# Patient Record
Sex: Female | Born: 1955 | Race: Black or African American | Hispanic: No | Marital: Married | Smoking: Never smoker
Health system: Southern US, Community
[De-identification: ages and names within clinical notes are randomized; demographics above are authoritative.]

## PROBLEM LIST (undated history)

## (undated) DIAGNOSIS — K5792 Diverticulitis of intestine, part unspecified, without perforation or abscess without bleeding: Secondary | ICD-10-CM

---

## 1998-04-02 ENCOUNTER — Other Ambulatory Visit: Admission: RE | Admit: 1998-04-02 | Discharge: 1998-04-02 | Payer: Self-pay | Admitting: *Deleted

## 1999-04-26 ENCOUNTER — Other Ambulatory Visit: Admission: RE | Admit: 1999-04-26 | Discharge: 1999-04-26 | Payer: Self-pay | Admitting: *Deleted

## 2000-03-24 ENCOUNTER — Ambulatory Visit (HOSPITAL_COMMUNITY): Admission: RE | Admit: 2000-03-24 | Discharge: 2000-03-24 | Payer: Self-pay | Admitting: *Deleted

## 2000-03-24 ENCOUNTER — Encounter: Payer: Self-pay | Admitting: *Deleted

## 2000-08-05 ENCOUNTER — Emergency Department (HOSPITAL_COMMUNITY): Admission: EM | Admit: 2000-08-05 | Discharge: 2000-08-05 | Payer: Self-pay | Admitting: Emergency Medicine

## 2000-08-08 ENCOUNTER — Encounter: Payer: Self-pay | Admitting: Internal Medicine

## 2000-08-08 ENCOUNTER — Ambulatory Visit (HOSPITAL_COMMUNITY): Admission: RE | Admit: 2000-08-08 | Discharge: 2000-08-08 | Payer: Self-pay | Admitting: Internal Medicine

## 2001-02-13 ENCOUNTER — Other Ambulatory Visit: Admission: RE | Admit: 2001-02-13 | Discharge: 2001-02-13 | Payer: Self-pay | Admitting: *Deleted

## 2002-01-08 ENCOUNTER — Other Ambulatory Visit: Admission: RE | Admit: 2002-01-08 | Discharge: 2002-01-08 | Payer: Self-pay | Admitting: *Deleted

## 2003-07-09 ENCOUNTER — Other Ambulatory Visit: Admission: RE | Admit: 2003-07-09 | Discharge: 2003-07-09 | Payer: Self-pay | Admitting: Family Medicine

## 2003-07-16 ENCOUNTER — Ambulatory Visit (HOSPITAL_COMMUNITY): Admission: RE | Admit: 2003-07-16 | Discharge: 2003-07-16 | Payer: Self-pay | Admitting: Family Medicine

## 2004-08-23 ENCOUNTER — Ambulatory Visit (HOSPITAL_COMMUNITY): Admission: RE | Admit: 2004-08-23 | Discharge: 2004-08-23 | Payer: Self-pay | Admitting: Family Medicine

## 2004-09-27 ENCOUNTER — Other Ambulatory Visit: Admission: RE | Admit: 2004-09-27 | Discharge: 2004-09-27 | Payer: Self-pay | Admitting: Family Medicine

## 2004-11-15 ENCOUNTER — Ambulatory Visit (HOSPITAL_COMMUNITY): Admission: RE | Admit: 2004-11-15 | Discharge: 2004-11-15 | Payer: Self-pay | Admitting: Gastroenterology

## 2005-10-05 ENCOUNTER — Other Ambulatory Visit: Admission: RE | Admit: 2005-10-05 | Discharge: 2005-10-05 | Payer: Self-pay | Admitting: Family Medicine

## 2005-12-15 ENCOUNTER — Ambulatory Visit (HOSPITAL_COMMUNITY): Admission: RE | Admit: 2005-12-15 | Discharge: 2005-12-15 | Payer: Self-pay | Admitting: Family Medicine

## 2006-12-20 ENCOUNTER — Ambulatory Visit (HOSPITAL_COMMUNITY): Admission: RE | Admit: 2006-12-20 | Discharge: 2006-12-20 | Payer: Self-pay | Admitting: Family Medicine

## 2007-01-18 ENCOUNTER — Other Ambulatory Visit: Admission: RE | Admit: 2007-01-18 | Discharge: 2007-01-18 | Payer: Self-pay | Admitting: Family Medicine

## 2008-01-10 ENCOUNTER — Ambulatory Visit (HOSPITAL_COMMUNITY): Admission: RE | Admit: 2008-01-10 | Discharge: 2008-01-10 | Payer: Self-pay | Admitting: Family Medicine

## 2008-01-21 ENCOUNTER — Encounter: Admission: RE | Admit: 2008-01-21 | Discharge: 2008-01-21 | Payer: Self-pay | Admitting: Family Medicine

## 2008-01-21 ENCOUNTER — Other Ambulatory Visit: Admission: RE | Admit: 2008-01-21 | Discharge: 2008-01-21 | Payer: Self-pay | Admitting: Family Medicine

## 2009-01-19 ENCOUNTER — Ambulatory Visit (HOSPITAL_COMMUNITY): Admission: RE | Admit: 2009-01-19 | Discharge: 2009-01-19 | Payer: Self-pay | Admitting: Family Medicine

## 2010-01-29 ENCOUNTER — Ambulatory Visit (HOSPITAL_COMMUNITY): Admission: RE | Admit: 2010-01-29 | Discharge: 2010-01-29 | Payer: Self-pay | Admitting: Family Medicine

## 2010-06-28 ENCOUNTER — Encounter: Payer: Self-pay | Admitting: Family Medicine

## 2010-09-07 ENCOUNTER — Other Ambulatory Visit: Payer: Self-pay | Admitting: Family Medicine

## 2010-09-07 DIAGNOSIS — M545 Low back pain, unspecified: Secondary | ICD-10-CM

## 2010-09-10 ENCOUNTER — Ambulatory Visit
Admission: RE | Admit: 2010-09-10 | Discharge: 2010-09-10 | Disposition: A | Discharge status: Home / Self Care | Payer: Private Health Insurance - Indemnity | Source: Ambulatory Visit | Attending: Family Medicine | Admitting: Family Medicine

## 2010-09-10 DIAGNOSIS — M545 Low back pain, unspecified: Secondary | ICD-10-CM

## 2010-10-22 NOTE — Op Note (Signed)
NAME:  YULEIDY, RAPPLEYE NO.:  [id number]   MEDICAL RECORD NO.:  [id number]          PATIENT TYPE:  AMB   LOCATION:  ENDO                         FACILITY:  [facility]   PHYSICIAN:  Graylin Shiver, M.D.   DATE OF BIRTH:  Jan 21, 1956   DATE OF PROCEDURE:  11/15/2004  DATE OF DISCHARGE:                                 OPERATIVE REPORT   PROCEDURE:  Colonoscopy.   INDICATIONS FOR PROCEDURE:  Screening.   Informed consent was obtained after explanation of the risks of bleeding,  infection, and perforation.   PREMEDICATION:  1.  Fentanyl 75 mcg IV.  2.  Versed 6 mg IV.   PROCEDURE:  With the patient in the left lateral decubitus position, a  rectal exam was performed.  No masses were felt.  The Olympus colonoscope  was inserted into the rectum and advanced around the colon to the cecum.  Cecal landmarks were identified.  The cecum and ascending colon were normal.  The transverse colon normal.  The descending colon, sigmoid, and rectum were  normal.  She tolerated the procedure well without complications.   IMPRESSION:  Normal colonoscopy to the cecum.       SFG/MEDQ  D:  11/15/2004  T:  11/15/2004  Job:  [id number]   cc:   Stacie Acres. White, M.D.  [address]  [place]  [place] [address]  Fax: [phone]

## 2011-01-05 ENCOUNTER — Other Ambulatory Visit (HOSPITAL_COMMUNITY)
Admission: RE | Admit: 2011-01-05 | Discharge: 2011-01-05 | Disposition: A | Discharge status: Home / Self Care | Payer: Private Health Insurance - Indemnity | Source: Ambulatory Visit | Attending: Family Medicine | Admitting: Family Medicine

## 2011-01-05 ENCOUNTER — Other Ambulatory Visit: Payer: Self-pay | Admitting: Family Medicine

## 2011-01-05 DIAGNOSIS — Z01419 Encounter for gynecological examination (general) (routine) without abnormal findings: Secondary | ICD-10-CM | POA: Insufficient documentation

## 2011-01-13 ENCOUNTER — Other Ambulatory Visit (HOSPITAL_COMMUNITY): Payer: Self-pay | Admitting: Family Medicine

## 2011-01-13 DIAGNOSIS — Z1231 Encounter for screening mammogram for malignant neoplasm of breast: Secondary | ICD-10-CM

## 2011-02-10 ENCOUNTER — Ambulatory Visit (HOSPITAL_COMMUNITY)
Admission: RE | Admit: 2011-02-10 | Discharge: 2011-02-10 | Disposition: A | Discharge status: Home / Self Care | Payer: Private Health Insurance - Indemnity | Source: Ambulatory Visit | Attending: Family Medicine | Admitting: Family Medicine

## 2011-02-10 DIAGNOSIS — Z1231 Encounter for screening mammogram for malignant neoplasm of breast: Secondary | ICD-10-CM | POA: Insufficient documentation

## 2012-03-27 ENCOUNTER — Other Ambulatory Visit (HOSPITAL_COMMUNITY): Payer: Self-pay | Admitting: Family Medicine

## 2012-03-27 DIAGNOSIS — Z1231 Encounter for screening mammogram for malignant neoplasm of breast: Secondary | ICD-10-CM

## 2012-04-11 ENCOUNTER — Ambulatory Visit (HOSPITAL_COMMUNITY)
Admission: RE | Admit: 2012-04-11 | Discharge: 2012-04-11 | Disposition: A | Discharge status: Home / Self Care | Payer: Private Health Insurance - Indemnity | Source: Ambulatory Visit | Attending: Family Medicine | Admitting: Family Medicine

## 2012-04-11 DIAGNOSIS — Z1231 Encounter for screening mammogram for malignant neoplasm of breast: Secondary | ICD-10-CM

## 2013-03-09 ENCOUNTER — Emergency Department (HOSPITAL_COMMUNITY): Payer: Managed Care, Other (non HMO)

## 2013-03-09 ENCOUNTER — Encounter (HOSPITAL_COMMUNITY): Payer: Self-pay | Admitting: *Deleted

## 2013-03-09 ENCOUNTER — Inpatient Hospital Stay (HOSPITAL_COMMUNITY)
Admission: EM | Admit: 2013-03-09 | Discharge: 2013-03-12 | DRG: 417 | Disposition: A | Discharge status: Home / Self Care | Payer: Managed Care, Other (non HMO) | Attending: General Surgery | Admitting: General Surgery

## 2013-03-09 DIAGNOSIS — K8020 Calculus of gallbladder without cholecystitis without obstruction: Secondary | ICD-10-CM | POA: Diagnosis present

## 2013-03-09 DIAGNOSIS — Z79899 Other long term (current) drug therapy: Secondary | ICD-10-CM

## 2013-03-09 DIAGNOSIS — K859 Acute pancreatitis without necrosis or infection, unspecified: Secondary | ICD-10-CM | POA: Diagnosis present

## 2013-03-09 DIAGNOSIS — K8062 Calculus of gallbladder and bile duct with acute cholecystitis without obstruction: Principal | ICD-10-CM | POA: Diagnosis present

## 2013-03-09 LAB — COMPREHENSIVE METABOLIC PANEL
Alkaline Phosphatase: 107 U/L (ref 39–117)
BUN: 13 mg/dL (ref 6–23)
CO2: 28 mEq/L (ref 19–32)
Chloride: 102 mEq/L (ref 96–112)
GFR calc Af Amer: 78 mL/min — ABNORMAL LOW (ref >90)
GFR calc non Af Amer: 67 mL/min — ABNORMAL LOW (ref >90)
Glucose, Bld: 103 mg/dL — ABNORMAL HIGH (ref 70–99)
Potassium: 4.4 mEq/L (ref 3.5–5.1)
Total Bilirubin: 0.3 mg/dL (ref 0.3–1.2)

## 2013-03-09 LAB — CBC WITH DIFFERENTIAL/PLATELET
HCT: 37.5 % (ref 36.0–46.0)
Hemoglobin: 12.6 g/dL (ref 12.0–15.0)
Lymphs Abs: 3.3 10*3/uL (ref 0.7–4.0)
Monocytes Absolute: 0.7 10*3/uL (ref 0.1–1.0)
Monocytes Relative: 7 % (ref 3–12)
Neutro Abs: 6.1 10*3/uL (ref 1.7–7.7)
Neutrophils Relative %: 60 % (ref 43–77)
RBC: 4.31 MIL/uL (ref 3.87–5.11)

## 2013-03-09 LAB — URINALYSIS, ROUTINE W REFLEX MICROSCOPIC
Bilirubin Urine: NEGATIVE
Ketones, ur: NEGATIVE mg/dL
Nitrite: NEGATIVE
Protein, ur: NEGATIVE mg/dL
Urobilinogen, UA: 0.2 mg/dL (ref 0.0–1.0)
pH: 7 (ref 5.0–8.0)

## 2013-03-09 LAB — LIPASE, BLOOD: Lipase: 153 U/L — ABNORMAL HIGH (ref 11–59)

## 2013-03-09 MED ORDER — ONDANSETRON HCL 4 MG/2ML IJ SOLN
4.0000 mg | Freq: Once | INTRAMUSCULAR | Status: AC
  Administered 2013-03-09: 4 mg via INTRAVENOUS
  Filled 2013-03-09: qty 2

## 2013-03-09 MED ORDER — ONDANSETRON HCL 4 MG/2ML IJ SOLN
4.0000 mg | Freq: Four times a day (QID) | INTRAMUSCULAR | Status: DC | PRN
  Administered 2013-03-10: 4 mg via INTRAVENOUS
  Filled 2013-03-09 (×2): qty 2

## 2013-03-09 MED ORDER — SODIUM CHLORIDE 0.9 % IV BOLUS (SEPSIS)
1000.0000 mL | Freq: Once | INTRAVENOUS | Status: AC
  Administered 2013-03-09: 1000 mL via INTRAVENOUS

## 2013-03-09 MED ORDER — MORPHINE SULFATE 2 MG/ML IJ SOLN
2.0000 mg | INTRAMUSCULAR | Status: DC | PRN
  Administered 2013-03-09: 2 mg via INTRAVENOUS
  Filled 2013-03-09: qty 1

## 2013-03-09 MED ORDER — ACETAMINOPHEN 325 MG PO TABS: 650.0000 mg | ORAL_TABLET | Freq: Four times a day (QID) | ORAL | Status: DC | PRN

## 2013-03-09 MED ORDER — HEPARIN SODIUM (PORCINE) 5000 UNIT/ML IJ SOLN
5000.0000 [IU] | Freq: Three times a day (TID) | INTRAMUSCULAR | Status: DC
  Administered 2013-03-09 – 2013-03-12 (×7): 5000 [IU] via SUBCUTANEOUS
  Filled 2013-03-09 (×11): qty 1

## 2013-03-09 MED ORDER — HYDROMORPHONE HCL PF 1 MG/ML IJ SOLN
1.0000 mg | Freq: Once | INTRAMUSCULAR | Status: AC
  Administered 2013-03-09: 1 mg via INTRAVENOUS
  Filled 2013-03-09: qty 1

## 2013-03-09 MED ORDER — ONDANSETRON HCL 4 MG PO TABS: 4.0000 mg | ORAL_TABLET | Freq: Four times a day (QID) | ORAL | Status: DC | PRN

## 2013-03-09 MED ORDER — ACETAMINOPHEN 650 MG RE SUPP: 650.0000 mg | Freq: Four times a day (QID) | RECTAL | Status: DC | PRN

## 2013-03-09 MED ORDER — SODIUM CHLORIDE 0.9 % IV SOLN
INTRAVENOUS | Status: DC
  Administered 2013-03-09: 23:00:00 via INTRAVENOUS

## 2013-03-09 NOTE — ED Provider Notes (Signed)
CSN: [id number]     Arrival date & time 03/09/13  1814 History   First MD Initiated Contact with Patient 03/09/13 1832     Chief Complaint  Patient presents with  . Abdominal Pain   (Consider location/radiation/quality/duration/timing/severity/associated sxs/prior Treatment) HPI I 57 y.o. Female complaining of epigastric abdominal pain. The pain began approximately 2 days ago and has been constant in nature. It is worsened with any by mouth intake. She has been nauseated but has not vomited. She has not had any fever, chills, or diarrhea. She has had no similar episodes in the past. She is previously healthy and does not take any medications. She does not drink alcohol or smoke. The pain is severe and crampy in the upper abdomen with some radiation to the back.  History reviewed. No pertinent past medical history. No past surgical history on file. No family history on file. History  Substance Use Topics  . Smoking status: Not on file  . Smokeless tobacco: Not on file  . Alcohol Use: Not on file   OB History   Grav Para Term Preterm Abortions TAB SAB Ect Mult Living                 Review of Systems  All other systems reviewed and are negative.    Allergies  Review of patient's allergies indicates no known allergies.  Home Medications   Current Outpatient Rx  Name  Route  Sig  Dispense  Refill  . bismuth subsalicylate (PEPTO BISMOL) 262 MG/15ML suspension   Oral   Take 15 mLs by mouth every 6 (six) hours as needed for indigestion.         . chlorpheniramine-HYDROcodone (TUSSIONEX) 10-8 MG/5ML LQCR   Oral   Take 5 mLs by mouth every 12 (twelve) hours as needed (cough).          Marland Kitchen HYDROcodone-homatropine (HYCODAN) 5-1.5 MG/5ML syrup   Oral   Take 5 mLs by mouth every 4 (four) hours as needed for cough.           BP 115/79  Pulse 85  Temp(Src) 98.7 F (37.1 C) (Oral)  Resp 16  SpO2 100% Physical Exam  Nursing note and vitals reviewed. Constitutional: She is  oriented to person, place, and time. She appears well-developed and well-nourished.  HENT:  Head: Normocephalic and atraumatic.  Right Ear: External ear normal.  Left Ear: External ear normal.  Nose: Nose normal.  Mouth/Throat: Oropharynx is clear and moist.  Eyes: Conjunctivae and EOM are normal. Pupils are equal, round, and reactive to light.  Neck: Neck supple.  Cardiovascular: Normal rate, regular rhythm, normal heart sounds and intact distal pulses.   Pulmonary/Chest: Effort normal and breath sounds normal.  Abdominal: Soft. Bowel sounds are normal.  Musculoskeletal: Normal range of motion.  Neurological: She is alert and oriented to person, place, and time. She has normal reflexes.  Skin: Skin is warm and dry.  Psychiatric: She has a normal mood and affect. Her behavior is normal. Judgment and thought content normal.    ED Course  Procedures (including critical care time) Labs Review Labs Reviewed  COMPREHENSIVE METABOLIC PANEL - Abnormal; Notable for the following:    Glucose, Bld 103 (*)    Albumin 3.3 (*)    AST 43 (*)    ALT 68 (*)    GFR calc non Af Amer 67 (*)    GFR calc Af Amer 78 (*)    All other components within normal limits  LIPASE,  BLOOD - Abnormal; Notable for the following:    Lipase 153 (*)    All other components within normal limits  URINALYSIS, ROUTINE W REFLEX MICROSCOPIC - Abnormal; Notable for the following:    Hgb urine dipstick SMALL (*)    Leukocytes, UA SMALL (*)    All other components within normal limits  CBC WITH DIFFERENTIAL  URINE MICROSCOPIC-ADD ON   Imaging Review US Abdomen Complete  03/09/2013   CLINICAL DATA:  Abdominal pain and nausea  EXAM: ULTRASOUND ABDOMEN COMPLETE  COMPARISON:  CT 10/17/2005  FINDINGS: Gallbladder  Dependent gallstone noted, largest 1 cm. No gallbladder wall thickening, pericholecystic fluid, or sonographic Murphy sign.  Common bile duct  Diameter: 4 mm. Normal.  Liver  No focal lesion identified. Within  normal limits in parenchymal echogenicity.  IVC  No abnormality visualized.  Pancreas  Visualized portion unremarkable.  Spleen  Size and appearance within normal limits.  Right Kidney  Length: 11.0 cm. Extra renal pelvis reidentified. Echogenicity within normal limits. No mass or hydronephrosis visualized.  Left Kidney  Length: 11.0 cm. Extra renal pelvis reidentified. Echogenicity within normal limits. No mass or hydronephrosis visualized.  Abdominal aorta  No aneurysm visualized.  IMPRESSION: Gallstone without other sonographic evidence for acute cholecystitis. No acute intra-abdominal abnormality.   Electronically Signed   By: Christiana Pellant M.D.   On: 03/09/2013 21:00    MDM   Results for orders placed during the hospital encounter of 03/09/13  CBC WITH DIFFERENTIAL      Result Value Range   WBC 10.3  4.0 - 10.5 K/uL   RBC 4.31  3.87 - 5.11 MIL/uL   Hemoglobin 12.6  12.0 - 15.0 g/dL   HCT 16.1  09.6 - 04.5 %   MCV 87.0  78.0 - 100.0 fL   MCH 29.2  26.0 - 34.0 pg   MCHC 33.6  30.0 - 36.0 g/dL   RDW 40.9  81.1 - 91.4 %   Platelets 340  150 - 400 K/uL   Neutrophils Relative % 60  43 - 77 %   Neutro Abs 6.1  1.7 - 7.7 K/uL   Lymphocytes Relative 32  12 - 46 %   Lymphs Abs 3.3  0.7 - 4.0 K/uL   Monocytes Relative 7  3 - 12 %   Monocytes Absolute 0.7  0.1 - 1.0 K/uL   Eosinophils Relative 1  0 - 5 %   Eosinophils Absolute 0.1  0.0 - 0.7 K/uL   Basophils Relative 0  0 - 1 %   Basophils Absolute 0.0  0.0 - 0.1 K/uL  COMPREHENSIVE METABOLIC PANEL      Result Value Range   Sodium 139  135 - 145 mEq/L   Potassium 4.4  3.5 - 5.1 mEq/L   Chloride 102  96 - 112 mEq/L   CO2 28  19 - 32 mEq/L   Glucose, Bld 103 (*) 70 - 99 mg/dL   BUN 13  6 - 23 mg/dL   Creatinine, Ser 7.82  0.50 - 1.10 mg/dL   Calcium 9.3  8.4 - 95.6 mg/dL   Total Protein 7.5  6.0 - 8.3 g/dL   Albumin 3.3 (*) 3.5 - 5.2 g/dL   AST 43 (*) 0 - 37 U/L   ALT 68 (*) 0 - 35 U/L   Alkaline Phosphatase 107  39 - 117 U/L    Total Bilirubin 0.3  0.3 - 1.2 mg/dL   GFR calc non Af Amer 67 (*) >90 mL/min  GFR calc Af Amer 78 (*) >90 mL/min  LIPASE, BLOOD      Result Value Range   Lipase 153 (*) 11 - 59 U/L  URINALYSIS, ROUTINE W REFLEX MICROSCOPIC      Result Value Range   Color, Urine YELLOW  YELLOW   APPearance CLEAR  CLEAR   Specific Gravity, Urine 1.009  1.005 - 1.030   pH 7.0  5.0 - 8.0   Glucose, UA NEGATIVE  NEGATIVE mg/dL   Hgb urine dipstick SMALL (*) NEGATIVE   Bilirubin Urine NEGATIVE  NEGATIVE   Ketones, ur NEGATIVE  NEGATIVE mg/dL   Protein, ur NEGATIVE  NEGATIVE mg/dL   Urobilinogen, UA 0.2  0.0 - 1.0 mg/dL   Nitrite NEGATIVE  NEGATIVE   Leukocytes, UA SMALL (*) NEGATIVE  URINE MICROSCOPIC-ADD ON      Result Value Range   WBC, UA 0-2  <3 WBC/hpf   RBC / HPF 0-2  <3 RBC/hpf    57 year old female with epigastric pain and mildly elevated liver function tests and elevated lipase at 153 with gallstones seen on ultrasound likely with gallstone pancreatitis. She's given IV fluids, pain medicines, and antibiotics. Surgery and GI are being consulted. Plan consult to hospitalist for management.   Discussed with Dr. Maisie Fus on call for general surgery and they will see in consult   Discussed with Dr. Rhona Leavens and plan admit to [facility].  Awaiting gi. Discussed with Dr.Hung and he will see patient in consult.   Hilario Quarry, MD 03/09/13 [phone]

## 2013-03-09 NOTE — H&P (Signed)
[facility] History and Physical  DRAVEN LAINE ZOX:[id number] DOB: [birth year] DOA: 03/09/2013  Referring physician: Emergency Department PCP: Leanor Rubenstein, MD  Specialists:   Chief Complaint: Abdominal Pain  HPI: Kimberly Farrell is a 57 y.o. female  With no significant medical history who presents to the ED with complaints of acute abd pain, n/v that started several days ago. Pt initially attributed sx to "bad gas," however sx did not improve so pt wend to ED.  In the ED, the patient was found to have an elevated lipase to over 150 with a gallstone noted on abd [place]. Surgery and GI were consulted through the ED and the hospitalist was consulted for admission.  Review of Systems:  Per above, remainder of 10pt ros reviewed and are neg  History reviewed. No pertinent past medical history. No past surgical history on file. Social History:  has no tobacco, alcohol, and drug history on file.  where does patient live--home, ALF, SNF? and with whom if at home?  Can patient participate in ADLs?  No Known Allergies  No family history on file.  (be sure to complete)  Prior to Admission medications   Medication Sig Start Date End Date Taking? Authorizing Provider  bismuth subsalicylate (PEPTO BISMOL) 262 MG/15ML suspension Take 15 mLs by mouth every 6 (six) hours as needed for indigestion.   Yes Historical Provider, MD  chlorpheniramine-HYDROcodone (TUSSIONEX) 10-8 MG/5ML LQCR Take 5 mLs by mouth every 12 (twelve) hours as needed (cough).  03/04/13  Yes Historical Provider, MD  HYDROcodone-homatropine (HYCODAN) 5-1.5 MG/5ML syrup Take 5 mLs by mouth every 4 (four) hours as needed for cough.  02/27/13  Yes Historical Provider, MD   Physical Exam: Filed Vitals:   03/09/13 1828  BP: 115/79  Pulse: 85  Temp: 98.7 F (37.1 C)  TempSrc: Oral  Resp: 16  SpO2: 100%    General:  Awake, in nad  Eyes: PERRL B  ENT: Membranes moist, dentition fair  Neck: trachea midline, neck  supple  Cardiovascular: regular, s1, s2  Respiratory: normal resp effort, no wheezing  Abdomen: soft, pos BS, tender in epigastric region  Skin: normal skin turgor, no abnormal skin lesions seen  Musculoskeletal: perfused, no clubbing  Psychiatric: mood/affect normal // no auditory/visual hallucinations  Neurologic: cn2-12 grossly intact, strength/sensation intact  Labs on Admission:  Basic Metabolic Panel:  Recent Labs Lab 03/09/13 1915  NA 139  K 4.4  CL 102  CO2 28  GLUCOSE 103*  BUN 13  CREATININE 0.92  CALCIUM 9.3   Liver Function Tests:  Recent Labs Lab 03/09/13 1915  AST 43*  ALT 68*  ALKPHOS 107  BILITOT 0.3  PROT 7.5  ALBUMIN 3.3*    Recent Labs Lab 03/09/13 1915  LIPASE 153*   No results found for this basename: AMMONIA,  in the last 168 hours CBC:  Recent Labs Lab 03/09/13 1915  WBC 10.3  NEUTROABS 6.1  HGB 12.6  HCT 37.5  MCV 87.0  PLT 340   Cardiac Enzymes: No results found for this basename: CKTOTAL, CKMB, CKMBINDEX, TROPONINI,  in the last 168 hours  BNP (last 3 results) No results found for this basename: PROBNP,  in the last 8760 hours CBG: No results found for this basename: GLUCAP,  in the last 168 hours  Radiological Exams on Admission: US Abdomen Complete  03/09/2013   CLINICAL DATA:  Abdominal pain and nausea  EXAM: ULTRASOUND ABDOMEN COMPLETE  COMPARISON:  CT 10/17/2005  FINDINGS: Gallbladder  Dependent gallstone  noted, largest 1 cm. No gallbladder wall thickening, pericholecystic fluid, or sonographic Murphy sign.  Common bile duct  Diameter: 4 mm. Normal.  Liver  No focal lesion identified. Within normal limits in parenchymal echogenicity.  IVC  No abnormality visualized.  Pancreas  Visualized portion unremarkable.  Spleen  Size and appearance within normal limits.  Right Kidney  Length: 11.0 cm. Extra renal pelvis reidentified. Echogenicity within normal limits. No mass or hydronephrosis visualized.  Left Kidney   Length: 11.0 cm. Extra renal pelvis reidentified. Echogenicity within normal limits. No mass or hydronephrosis visualized.  Abdominal aorta  No aneurysm visualized.  IMPRESSION: Gallstone without other sonographic evidence for acute cholecystitis. No acute intra-abdominal abnormality.   Electronically Signed   By: Christiana Pellant M.D.   On: 03/09/2013 21:00    Assessment/Plan Principal Problem:   Acute pancreatitis Active Problems:   Gall stones   1. Acute pancreatitis 1. Cont NPO with IVF and analgesics 2. Repeat lipase in AM 3. GI and General Surgery have already been consulted through the ED. Would f/u on recs 4. Admit to med-surg floor, inpt 2. Gall stones 1. No evidence of acute cholecystitis 2. Per above, Surgery consulted 3. DVT prophylaxis 1. Heparin subQ  Code Status: Full (must indicate code status--if unknown or must be presumed, indicate so) Family Communication: Pt in room (indicate person spoken with, if applicable, with phone number if by telephone) Disposition Plan: Pending (indicate anticipated LOS)  Time spent:  Dacie Mandel K [facility] Pager [phone]  If 7PM-7AM, please contact night-coverage [email or web address] Password [email or web address] 03/09/2013, 9:35 PM

## 2013-03-09 NOTE — ED Notes (Signed)
Reports abdominal pain since Tuesday, 7/10. Reports nausea. Denies vomiting.

## 2013-03-10 ENCOUNTER — Encounter (HOSPITAL_COMMUNITY): Admission: EM | Disposition: A | Discharge status: Home / Self Care | Payer: Self-pay | Source: Home / Self Care

## 2013-03-10 ENCOUNTER — Encounter (HOSPITAL_COMMUNITY): Payer: Self-pay | Admitting: Certified Registered Nurse Anesthetist

## 2013-03-10 ENCOUNTER — Inpatient Hospital Stay (HOSPITAL_COMMUNITY): Payer: Managed Care, Other (non HMO)

## 2013-03-10 ENCOUNTER — Encounter (HOSPITAL_COMMUNITY): Payer: Self-pay | Admitting: Unknown Physician Specialty

## 2013-03-10 ENCOUNTER — Inpatient Hospital Stay (HOSPITAL_COMMUNITY): Payer: Managed Care, Other (non HMO) | Admitting: Certified Registered Nurse Anesthetist

## 2013-03-10 DIAGNOSIS — K8010 Calculus of gallbladder with chronic cholecystitis without obstruction: Secondary | ICD-10-CM

## 2013-03-10 HISTORY — PX: CHOLECYSTECTOMY: SHX55

## 2013-03-10 LAB — COMPREHENSIVE METABOLIC PANEL
AST: 30 U/L (ref 0–37)
Albumin: 2.6 g/dL — ABNORMAL LOW (ref 3.5–5.2)
BUN: 8 mg/dL (ref 6–23)
CO2: 24 mEq/L (ref 19–32)
Calcium: 8.3 mg/dL — ABNORMAL LOW (ref 8.4–10.5)
Creatinine, Ser: 0.78 mg/dL (ref 0.50–1.10)
Sodium: 139 mEq/L (ref 135–145)
Total Protein: 6.3 g/dL (ref 6.0–8.3)

## 2013-03-10 LAB — CBC
HCT: 33.9 % — ABNORMAL LOW (ref 36.0–46.0)
Hemoglobin: 11.4 g/dL — ABNORMAL LOW (ref 12.0–15.0)
MCV: 86.7 fL (ref 78.0–100.0)
Platelets: 301 10*3/uL (ref 150–400)
RBC: 3.91 MIL/uL (ref 3.87–5.11)
WBC: 9.2 10*3/uL (ref 4.0–10.5)

## 2013-03-10 SURGERY — LAPAROSCOPIC CHOLECYSTECTOMY WITH INTRAOPERATIVE CHOLANGIOGRAM
Anesthesia: General | Site: Abdomen | Wound class: Clean Contaminated

## 2013-03-10 MED ORDER — CEFOXITIN SODIUM 2 G IV SOLR
2.0000 g | INTRAVENOUS | Status: AC
  Administered 2013-03-10: 2 g via INTRAVENOUS
  Filled 2013-03-10: qty 2

## 2013-03-10 MED ORDER — DEXTROSE 5 % IV SOLN
INTRAVENOUS | Status: AC
  Filled 2013-03-10: qty 1

## 2013-03-10 MED ORDER — LACTATED RINGERS IV SOLN
INTRAVENOUS | Status: DC | PRN
  Administered 2013-03-10 (×2): via INTRAVENOUS

## 2013-03-10 MED ORDER — NALOXONE HCL 0.4 MG/ML IJ SOLN
INTRAMUSCULAR | Status: DC | PRN
  Administered 2013-03-10: 40 ug via INTRAVENOUS

## 2013-03-10 MED ORDER — SODIUM CHLORIDE 0.9 % IV SOLN
INTRAVENOUS | Status: DC
  Administered 2013-03-10 – 2013-03-11 (×2): via INTRAVENOUS

## 2013-03-10 MED ORDER — ONDANSETRON HCL 4 MG/2ML IJ SOLN
INTRAMUSCULAR | Status: DC | PRN
  Administered 2013-03-10 (×2): 2 mg via INTRAVENOUS

## 2013-03-10 MED ORDER — CISATRACURIUM BESYLATE (PF) 10 MG/5ML IV SOLN
INTRAVENOUS | Status: DC | PRN
  Administered 2013-03-10: 6 mg via INTRAVENOUS

## 2013-03-10 MED ORDER — SUCCINYLCHOLINE CHLORIDE 20 MG/ML IJ SOLN
INTRAMUSCULAR | Status: DC | PRN
  Administered 2013-03-10: 100 mg via INTRAVENOUS

## 2013-03-10 MED ORDER — BUPIVACAINE-EPINEPHRINE 0.25% -1:200000 IJ SOLN
INTRAMUSCULAR | Status: DC | PRN
  Administered 2013-03-10: 20 mL

## 2013-03-10 MED ORDER — PROPOFOL 10 MG/ML IV BOLUS
INTRAVENOUS | Status: DC | PRN
  Administered 2013-03-10: 175 mg via INTRAVENOUS

## 2013-03-10 MED ORDER — 0.9 % SODIUM CHLORIDE (POUR BTL) OPTIME
TOPICAL | Status: DC | PRN
  Administered 2013-03-10: 1000 mL

## 2013-03-10 MED ORDER — BUPIVACAINE-EPINEPHRINE 0.25% -1:200000 IJ SOLN
INTRAMUSCULAR | Status: AC
  Filled 2013-03-10: qty 1

## 2013-03-10 MED ORDER — IOHEXOL 300 MG/ML  SOLN
INTRAMUSCULAR | Status: DC | PRN
  Administered 2013-03-10: 6 mL via INTRAVENOUS

## 2013-03-10 MED ORDER — DEXAMETHASONE SODIUM PHOSPHATE 10 MG/ML IJ SOLN
INTRAMUSCULAR | Status: DC | PRN
  Administered 2013-03-10: 10 mg via INTRAVENOUS

## 2013-03-10 MED ORDER — LACTATED RINGERS IR SOLN
Status: DC | PRN
  Administered 2013-03-10: 1

## 2013-03-10 MED ORDER — MIDAZOLAM HCL 5 MG/5ML IJ SOLN
INTRAMUSCULAR | Status: DC | PRN
  Administered 2013-03-10: 0.5 mg via INTRAVENOUS
  Administered 2013-03-10: 1 mg via INTRAVENOUS

## 2013-03-10 MED ORDER — EPHEDRINE SULFATE 50 MG/ML IJ SOLN
INTRAMUSCULAR | Status: DC | PRN
  Administered 2013-03-10 (×2): 5 mg via INTRAVENOUS

## 2013-03-10 MED ORDER — OXYCODONE-ACETAMINOPHEN 5-325 MG PO TABS
1.0000 | ORAL_TABLET | ORAL | Status: DC | PRN
  Administered 2013-03-11 (×3): 2 via ORAL
  Filled 2013-03-10 (×3): qty 2

## 2013-03-10 MED ORDER — NEOSTIGMINE METHYLSULFATE 1 MG/ML IJ SOLN
INTRAMUSCULAR | Status: DC | PRN
  Administered 2013-03-10: 5 mg via INTRAVENOUS

## 2013-03-10 MED ORDER — HYDROMORPHONE HCL PF 1 MG/ML IJ SOLN: 0.2500 mg | INTRAMUSCULAR | Status: DC | PRN

## 2013-03-10 MED ORDER — MORPHINE SULFATE 2 MG/ML IJ SOLN
2.0000 mg | INTRAMUSCULAR | Status: DC | PRN
  Administered 2013-03-10 (×2): 4 mg via INTRAVENOUS
  Administered 2013-03-10: 2 mg via INTRAVENOUS
  Administered 2013-03-10 – 2013-03-11 (×3): 4 mg via INTRAVENOUS
  Administered 2013-03-11 (×2): 2 mg via INTRAVENOUS
  Filled 2013-03-10: qty 2
  Filled 2013-03-10 (×2): qty 1
  Filled 2013-03-10 (×4): qty 2
  Filled 2013-03-10: qty 1

## 2013-03-10 MED ORDER — GLYCOPYRROLATE 0.2 MG/ML IJ SOLN
INTRAMUSCULAR | Status: DC | PRN
  Administered 2013-03-10: .8 mg via INTRAVENOUS

## 2013-03-10 MED ORDER — PHENYLEPHRINE HCL 10 MG/ML IJ SOLN
INTRAMUSCULAR | Status: DC | PRN
  Administered 2013-03-10 (×3): 40 ug via INTRAVENOUS

## 2013-03-10 MED ORDER — HYDROMORPHONE HCL PF 1 MG/ML IJ SOLN
INTRAMUSCULAR | Status: AC
  Filled 2013-03-10: qty 1

## 2013-03-10 MED ORDER — LIDOCAINE HCL (CARDIAC) 20 MG/ML IV SOLN
INTRAVENOUS | Status: DC | PRN
  Administered 2013-03-10: 75 mg via INTRAVENOUS

## 2013-03-10 MED ORDER — FENTANYL CITRATE 0.05 MG/ML IJ SOLN
INTRAMUSCULAR | Status: DC | PRN
  Administered 2013-03-10: 100 ug via INTRAVENOUS
  Administered 2013-03-10 (×3): 50 ug via INTRAVENOUS

## 2013-03-10 SURGICAL SUPPLY — 35 items
ADH SKN CLS APL DERMABOND .7 (GAUZE/BANDAGES/DRESSINGS) ×1
APPLIER CLIP 5 13 M/L LIGAMAX5 (MISCELLANEOUS) ×2
APR CLP MED LRG 5 ANG JAW (MISCELLANEOUS) ×1
BAG SPEC RTRVL LRG 6X4 10 (ENDOMECHANICALS) ×1
CABLE HIGH FREQUENCY MONO STRZ (ELECTRODE) ×2 IMPLANT
CANISTER SUCTION 2500CC (MISCELLANEOUS) ×2 IMPLANT
CHLORAPREP W/TINT 26ML (MISCELLANEOUS) ×2 IMPLANT
CLIP APPLIE 5 13 M/L LIGAMAX5 (MISCELLANEOUS) ×1 IMPLANT
CLOTH BEACON ORANGE TIMEOUT ST (SAFETY) ×2 IMPLANT
COVER MAYO STAND STRL (DRAPES) ×1 IMPLANT
DERMABOND ADVANCED (GAUZE/BANDAGES/DRESSINGS) ×1
DERMABOND ADVANCED .7 DNX12 (GAUZE/BANDAGES/DRESSINGS) ×1 IMPLANT
DRAPE C-ARM 42X120 X-RAY (DRAPES) ×2 IMPLANT
DRAPE LAPAROSCOPIC ABDOMINAL (DRAPES) ×2 IMPLANT
DRAPE UTILITY XL STRL (DRAPES) ×2 IMPLANT
ELECT REM PT RETURN 9FT ADLT (ELECTROSURGICAL) ×2
ELECTRODE REM PT RTRN 9FT ADLT (ELECTROSURGICAL) ×1 IMPLANT
GOWN BRE IMP PREV XXLGXLNG (GOWN DISPOSABLE) ×2 IMPLANT
GOWN STRL REIN XL XLG (GOWN DISPOSABLE) ×4 IMPLANT
KIT BASIN OR (CUSTOM PROCEDURE TRAY) ×2 IMPLANT
NS IRRIG 1000ML POUR BTL (IV SOLUTION) ×2 IMPLANT
POUCH SPECIMEN RETRIEVAL 10MM (ENDOMECHANICALS) ×2 IMPLANT
SCISSORS ENDO CVD 5DCS (MISCELLANEOUS) ×2 IMPLANT
SET CHOLANGIOGRAPH MIX (MISCELLANEOUS) ×2 IMPLANT
SET IRRIG TUBING LAPAROSCOPIC (IRRIGATION / IRRIGATOR) ×2 IMPLANT
SOLUTION ANTI FOG 6CC (MISCELLANEOUS) ×2 IMPLANT
SUT MNCRL AB 4-0 PS2 18 (SUTURE) ×2 IMPLANT
SUT VIC AB 4-0 PS2 27 (SUTURE) ×1 IMPLANT
TOWEL OR 17X26 10 PK STRL BLUE (TOWEL DISPOSABLE) ×2 IMPLANT
TOWEL OR NON WOVEN STRL DISP B (DISPOSABLE) ×2 IMPLANT
TRAY LAP CHOLE (CUSTOM PROCEDURE TRAY) ×2 IMPLANT
TROCAR BLADELESS OPT 5 75 (ENDOMECHANICALS) ×2 IMPLANT
TROCAR SLEEVE XCEL 5X75 (ENDOMECHANICALS) ×4 IMPLANT
TROCAR XCEL BLUNT TIP 100MML (ENDOMECHANICALS) ×2 IMPLANT
TUBING INSUFFLATION 10FT LAP (TUBING) ×2 IMPLANT

## 2013-03-10 NOTE — Op Note (Signed)
03/09/2013 - 03/10/2013  10:34 AM  PATIENT:  Kimberly Farrell  57 y.o. female  Patient Care Team: Leanor Rubenstein, MD as PCP - General (Family Medicine)  PRE-OPERATIVE DIAGNOSIS:  Pancreatitis, cholelithiasis  POST-OPERATIVE DIAGNOSIS:  Pancreatitis, cholelithiasis  PROCEDURE:  LAPAROSCOPIC CHOLECYSTECTOMY WITH INTRAOPERATIVE CHOLANGIOGRAM  SURGEON:  Surgeon(s): [name], MD Kandis Cocking, MD  ASSISTANT: Ezzard Standing   ANESTHESIA:   local and general  EBL: 50ml Total I/O In: 1000 [I.V.:1000] Out: -   DRAINS: none   SPECIMEN:  Source of Specimen:  gallbladder  DISPOSITION OF SPECIMEN:  PATHOLOGY  COUNTS:  YES  PLAN OF CARE: Patient admitted  PATIENT DISPOSITION:  PACU - hemodynamically stable.  INDICATION:   The anatomy & physiology of hepatobiliary & pancreatic function was discussed.  The pathophysiology of gallbladder dysfunction was discussed.  Natural history risks without surgery was discussed.   I feel the risks of no intervention will lead to serious problems that outweigh the operative risks; therefore, I recommended cholecystectomy to remove the pathology.  I explained laparoscopic techniques with possible need for an open approach.  Probable cholangiogram to evaluate the bilary tract was explained as well.    Risks such as bleeding, infection, abscess, leak, injury to other organs, need for further treatment, heart attack, death, and other risks were discussed.  I noted a good likelihood this will help address the problem.  Possibility that this will not correct all abdominal symptoms was explained.  Goals of post-operative recovery were discussed as well.    OR FINDINGS: mildly inflamed gallbladder neck, small gallstones  DESCRIPTION:   The patient was identified & brought into the operating room. The patient was positioned supine with arms tucked. SCDs were active during the entire case. The patient underwent general anesthesia without any difficulty.  The  abdomen was prepped and draped in a sterile fashion. A Surgical Timeout was performed and confirmed our plan.  We positioned the patient in reverse [place] & right side up.  I placed a Hassan laparoscopic port through the umbilicus using open entry technique.  Entry was clean. There were no adhesions to the anterior abdominal wall supraumbilically.  We induced carbon dioxide insufflation. Camera inspection revealed no injury.   I proceeded to continue with laparoscopic technique. I placed a #5 port in mid subcostal region, another 5mm port in the right flank near the anterior axillary line, and a 5mm port in the left subxiphoid region obliquely within the falciform ligament.  I turned attention to the right upper quadrant.   The gallbladder fundus was elevated cephalad. I used cautery and blunt dissection to free the peritoneal coverings between the gallbladder and the liver on the posteriolateral and anteriomedial walls.   I used careful blunt and cautery dissection with a mary[place] dissector to help get a good critical view of the cystic artery and cystic duct. I did further dissection to free a few centimeters of the  gallbladder off the liver bed to get a good critical view of the infundibulum and cystic duct. I mobilized the cystic artery.  I skeletonized the cystic duct.  After getting a good 360 view, I decided to perform a cholangiogram.  I placed a clip on the infundibulum.   I did a partial cystic duct-otomy and ensured patency. I placed a 5 [place] cholangiocatheter through a puncture site at the right subcostal ridge of the abdominal wall and directed it into the cystic duct.  We ran a cholangiogram with dilute radio-opaque contrast and continuous  fluoroscopy.  Contrast flowed from a side branch consistent with cystic duct cannulization. Contrast flowed up the common hepatic duct into the right and left intrahepatic chains out to secondary radicals. Contrast flowed down the common bile duct  easily across the normal ampulla into the duodenum.  There were no filling defects.  This was consistent with a normal cholangiogram.  I removed the cholangiocatheter.  I placed clips on the cystic duct x3.  I completed cystic duct transection.   I placed clips on the cystic artery x3 with 2 proximally.  I ligated the cystic artery using scissors. I freed the gallbladder from its remaining attachments to the liver. I ensured hemostasis on the gallbladder fossa of the liver and elsewhere. I inspected the rest of the abdomen & detected no injury nor bleeding elsewhere.  I irrigated the RUQ with normal saline.  I removed the gallbladder through the umbilical port site.  I closed the umbilical fascia using 0 Vicryl stitches.   I closed the skin using 4-0 vicryl stitch.  Sterile dressings were applied. The patient was extubated & arrived in the PACU in stable condition.  I had discussed postoperative care with the patient in the holding area.   I will discuss  operative findings and postoperative goals / instructions with the patient's family.  Instructions are written in the chart as well.

## 2013-03-10 NOTE — Anesthesia Postprocedure Evaluation (Signed)
  Anesthesia Post-op Note  Patient: Kimberly Farrell  Procedure(s) Performed: Procedure(s): LAPAROSCOPIC CHOLECYSTECTOMY WITH INTRAOPERATIVE CHOLANGIOGRAM (N/A) Patient is awake and responsive, talking with no complaints Pain and nausea are reasonably well controlled. Vital signs are stable and clinically acceptable. Oxygen saturation is clinically acceptable. There are no apparent anesthetic complications at this time; possible sore jaw and lip trauma from Pos-Pressure mask discussed with family. Patient is ready for discharge.

## 2013-03-10 NOTE — Anesthesia Procedure Notes (Addendum)
Date/Time: 03/10/2013 9:58 AM Performed by: Phillips Grout E   Procedure Name: Intubation Date/Time: 03/10/2013 9:28 AM Performed by: Edison Pace Pre-anesthesia Checklist: Emergency Drugs available, Patient identified, Timeout performed, Suction available and Patient being monitored Patient Re-evaluated:Patient Re-evaluated prior to inductionOxygen Delivery Method: Circle system utilized Preoxygenation: Pre-oxygenation with 100% oxygen Intubation Type: IV induction and Cricoid Pressure applied Ventilation: Mask ventilation without difficulty Laryngoscope Size: Mac and 4 Grade View: Grade II Tube type: Oral Tube size: 7.5 mm Number of attempts: 1 Airway Equipment and Method: Stylet Placement Confirmation: ETT inserted through vocal cords under direct vision,  positive ETCO2 and breath sounds checked- equal and bilateral Secured at: 21 cm Tube secured with: Tape Dental Injury: Teeth and Oropharynx as per pre-operative assessment

## 2013-03-10 NOTE — Consult Note (Signed)
Consult for [facility]  Reason for Consult: [place] Referring Physician: [facility]  Herbie Saxon HPI: This is a 57 year old female with no significant PMH admitted for persistent and worsening epigastric pain.  Her pain started acutely this past Tuesday and it worsened over the intervening days.  She had this type of pain in the past, but it was fleeting.  She cannot recall if this pain started after PO intake.  When she presented to the ER she was noted to have a mild elevation in her lipase and the ultrasound revealed cholelithiasis.  Her CBD was measured at 4 mm and the liver panel was not significantly elevated.    History reviewed. No pertinent past medical history.  No past surgical history on file.  No family history on file.  Social History:  reports that she has never smoked. She has never used smokeless tobacco. She reports that she does not drink alcohol or use illicit drugs.  Allergies: No Known Allergies  Medications:  Scheduled: . heparin  5,000 Units Subcutaneous Q8H   Continuous: . sodium chloride 100 mL/hr at 03/09/13 2248    Results for orders placed during the hospital encounter of 03/09/13 (from the past 24 hour(s))  CBC WITH DIFFERENTIAL     Status: None   Collection Time    03/09/13  7:15 PM      Result Value Range   WBC 10.3  4.0 - 10.5 K/uL   RBC 4.31  3.87 - 5.11 MIL/uL   Hemoglobin 12.6  12.0 - 15.0 g/dL   HCT 16.1  09.6 - 04.5 %   MCV 87.0  78.0 - 100.0 fL   MCH 29.2  26.0 - 34.0 pg   MCHC 33.6  30.0 - 36.0 g/dL   RDW 40.9  81.1 - 91.4 %   Platelets 340  150 - 400 K/uL   Neutrophils Relative % 60  43 - 77 %   Neutro Abs 6.1  1.7 - 7.7 K/uL   Lymphocytes Relative 32  12 - 46 %   Lymphs Abs 3.3  0.7 - 4.0 K/uL   Monocytes Relative 7  3 - 12 %   Monocytes Absolute 0.7  0.1 - 1.0 K/uL   Eosinophils Relative 1  0 - 5 %   Eosinophils Absolute 0.1  0.0 - 0.7 K/uL   Basophils Relative 0  0 - 1 %   Basophils Absolute 0.0  0.0 - 0.1  K/uL  COMPREHENSIVE METABOLIC PANEL     Status: Abnormal   Collection Time    03/09/13  7:15 PM      Result Value Range   Sodium 139  135 - 145 mEq/L   Potassium 4.4  3.5 - 5.1 mEq/L   Chloride 102  96 - 112 mEq/L   CO2 28  19 - 32 mEq/L   Glucose, Bld 103 (*) 70 - 99 mg/dL   BUN 13  6 - 23 mg/dL   Creatinine, Ser 7.82  0.50 - 1.10 mg/dL   Calcium 9.3  8.4 - 95.6 mg/dL   Total Protein 7.5  6.0 - 8.3 g/dL   Albumin 3.3 (*) 3.5 - 5.2 g/dL   AST 43 (*) 0 - 37 U/L   ALT 68 (*) 0 - 35 U/L   Alkaline Phosphatase 107  39 - 117 U/L   Total Bilirubin 0.3  0.3 - 1.2 mg/dL   GFR calc non Af Amer 67 (*) >90 mL/min   GFR calc Af Denyse Dago  78 (*) >90 mL/min  LIPASE, BLOOD     Status: Abnormal   Collection Time    03/09/13  7:15 PM      Result Value Range   Lipase 153 (*) 11 - 59 U/L  URINALYSIS, ROUTINE W REFLEX MICROSCOPIC     Status: Abnormal   Collection Time    03/09/13  7:37 PM      Result Value Range   Color, Urine YELLOW  YELLOW   APPearance CLEAR  CLEAR   Specific Gravity, Urine 1.009  1.005 - 1.030   pH 7.0  5.0 - 8.0   Glucose, UA NEGATIVE  NEGATIVE mg/dL   Hgb urine dipstick SMALL (*) NEGATIVE   Bilirubin Urine NEGATIVE  NEGATIVE   Ketones, ur NEGATIVE  NEGATIVE mg/dL   Protein, ur NEGATIVE  NEGATIVE mg/dL   Urobilinogen, UA 0.2  0.0 - 1.0 mg/dL   Nitrite NEGATIVE  NEGATIVE   Leukocytes, UA SMALL (*) NEGATIVE  URINE MICROSCOPIC-ADD ON     Status: None   Collection Time    03/09/13  7:37 PM      Result Value Range   WBC, UA 0-2  <3 WBC/hpf   RBC / HPF 0-2  <3 RBC/hpf  COMPREHENSIVE METABOLIC PANEL     Status: Abnormal   Collection Time    03/10/13  5:10 AM      Result Value Range   Sodium 139  135 - 145 mEq/L   Potassium 3.9  3.5 - 5.1 mEq/L   Chloride 106  96 - 112 mEq/L   CO2 24  19 - 32 mEq/L   Glucose, Bld 97  70 - 99 mg/dL   BUN 8  6 - 23 mg/dL   Creatinine, Ser 1.61  0.50 - 1.10 mg/dL   Calcium 8.3 (*) 8.4 - 10.5 mg/dL   Total Protein 6.3  6.0 - 8.3 g/dL    Albumin 2.6 (*) 3.5 - 5.2 g/dL   AST 30  0 - 37 U/L   ALT 53 (*) 0 - 35 U/L   Alkaline Phosphatase 94  39 - 117 U/L   Total Bilirubin 0.6  0.3 - 1.2 mg/dL   GFR calc non Af Amer >90  >90 mL/min   GFR calc Af Amer >90  >90 mL/min  CBC     Status: Abnormal   Collection Time    03/10/13  5:10 AM      Result Value Range   WBC 9.2  4.0 - 10.5 K/uL   RBC 3.91  3.87 - 5.11 MIL/uL   Hemoglobin 11.4 (*) 12.0 - 15.0 g/dL   HCT 09.6 (*) 04.5 - 40.9 %   MCV 86.7  78.0 - 100.0 fL   MCH 29.2  26.0 - 34.0 pg   MCHC 33.6  30.0 - 36.0 g/dL   RDW 81.1  91.4 - 78.2 %   Platelets 301  150 - 400 K/uL  LIPASE, BLOOD     Status: Abnormal   Collection Time    03/10/13  5:10 AM      Result Value Range   Lipase 87 (*) 11 - 59 U/L     US Abdomen Complete  03/09/2013   CLINICAL DATA:  Abdominal pain and nausea  EXAM: ULTRASOUND ABDOMEN COMPLETE  COMPARISON:  CT 10/17/2005  FINDINGS: Gallbladder  Dependent gallstone noted, largest 1 cm. No gallbladder wall thickening, pericholecystic fluid, or sonographic Murphy sign.  Common bile duct  Diameter: 4 mm. Normal.  Liver  No focal  lesion identified. Within normal limits in parenchymal echogenicity.  IVC  No abnormality visualized.  Pancreas  Visualized portion unremarkable.  Spleen  Size and appearance within normal limits.  Right Kidney  Length: 11.0 cm. Extra renal pelvis reidentified. Echogenicity within normal limits. No mass or hydronephrosis visualized.  Left Kidney  Length: 11.0 cm. Extra renal pelvis reidentified. Echogenicity within normal limits. No mass or hydronephrosis visualized.  Abdominal aorta  No aneurysm visualized.  IMPRESSION: Gallstone without other sonographic evidence for acute cholecystitis. No acute intra-abdominal abnormality.   Electronically Signed   By: Christiana Pellant M.D.   On: 03/09/2013 21:00    ROS:  As stated above in the HPI otherwise negative.  Blood pressure 108/57, pulse 76, temperature 98.1 F (36.7 C), temperature source  Oral, resp. rate 18, weight 125 lb 6.4 oz (56.881 kg), SpO2 99.00%.    PE: Gen: NAD, Alert and Oriented HEENT:  [place]/AT, EOMI Neck: Supple, no LAD Lungs: CTA Bilaterally CV: RRR without M/G/R ABM: Soft, tender in the epigastric region, +BS Ext: No C/C/E  Assessment/Plan: 1) Symptomatic cholelithiasis. 2) Mild pancreatitis. 3) Epigastric pain.   She may have passed a gallstone.  ER called me last evening and reported that she had a CBD stone, however, I cannot find any evidence of this assertion.  At this time she does not require any GI intervention.  Currently she is going to undergo a lap chole with Dr. Maisie Fus.    Plan: 1) Lap chole. 2) If IOC is positive for stones, then an ERCP will be performed by [facility].  Alisyn Lequire D 03/10/2013, 8:40 AM

## 2013-03-10 NOTE — Progress Notes (Signed)
Pt arrived to floor, room 1501 via stretcher. VS taken, pt oriented to room. General weakness and rating pain 7/10. No complications and initial assessment complete. Will continue to monitor and provide interventions as appropriate this shift.

## 2013-03-10 NOTE — Anesthesia Preprocedure Evaluation (Signed)
Anesthesia Evaluation  Patient identified by MRN, date of birth, ID band Patient awake    Reviewed: Allergy & Precautions, H&P , Patient's Chart, lab work & pertinent test results, reviewed documented beta blocker date and time   Airway Mallampati: II TM Distance: >3 FB Neck ROM: full    Dental no notable dental hx.    Pulmonary  breath sounds clear to auscultation  Pulmonary exam normal       Cardiovascular Rhythm:regular Rate:Normal     Neuro/Psych    GI/Hepatic   Endo/Other    Renal/GU      Musculoskeletal   Abdominal   Peds  Hematology   Anesthesia Other Findings   Reproductive/Obstetrics                           Anesthesia Physical Anesthesia Plan  ASA: II and emergent  Anesthesia Plan: General   Post-op Pain Management:    Induction: Intravenous  Airway Management Planned: Oral ETT  Additional Equipment:   Intra-op Plan:   Post-operative Plan: Extubation in OR  Informed Consent: I have reviewed the patients History and Physical, chart, labs and discussed the procedure including the risks, benefits and alternatives for the proposed anesthesia with the patient or authorized representative who has indicated his/her understanding and acceptance.   Dental Advisory Given and Dental advisory given  Plan Discussed with: CRNA and Surgeon  Anesthesia Plan Comments: (  Discussed general anesthesia, including possible nausea, instrumentation of airway, sore throat,pulmonary aspiration, etc. I asked if the were any outstanding questions, or  concerns before we proceeded. )        Anesthesia Quick Evaluation  

## 2013-03-10 NOTE — H&P (Addendum)
Chief Complaint  Patient presents with  . Abdominal Pain    HISTORY:  Kimberly Farrell is a 57 y.o. female who presents to the hospital with mid-epigastric pain that radiates to her back.  She states that she has had this pain in the past about 2 months ago but it went away rather quickly.  Pain got worse with eating a big salad yesterday.  The pain worsened almost immediately after she started eating.  This current episode has been occuring since Wed.  She has had some minimal nausea and bloating.  She has no h/o alcohol use.    History reviewed. No pertinent past medical history.     No past surgical history on file.    Current Facility-Administered Medications  Medication Dose Route Frequency Provider Last Rate Last Dose  . 0.9 %  sodium chloride infusion   Intravenous Continuous Jerald Kief, MD 100 mL/hr at 03/09/13 2248    . acetaminophen (TYLENOL) tablet 650 mg  650 mg Oral Q6H PRN Jerald Kief, MD       Or  . acetaminophen (TYLENOL) suppository 650 mg  650 mg Rectal Q6H PRN Jerald Kief, MD      . heparin injection 5,000 Units  5,000 Units Subcutaneous Q8H Jerald Kief, MD   5,000 Units at 03/10/13 [phone]  . morphine 2 MG/ML injection 2-4 mg  2-4 mg Intravenous Q4H PRN Rolan Lipa, NP   4 mg at 03/10/13 0545  . ondansetron (ZOFRAN) tablet 4 mg  4 mg Oral Q6H PRN Jerald Kief, MD       Or  . ondansetron [facility]) injection 4 mg  4 mg Intravenous Q6H PRN Jerald Kief, MD   4 mg at 03/10/13 0543     No Known Allergies    No family history on file.    History   Social History  . Marital Status: Married    Spouse Name: N/A    Number of Children: N/A  . Years of Education: N/A   Social History Main Topics  . Smoking status: None  . Smokeless tobacco: None  . Alcohol Use: None  . Drug Use: None  . Sexual Activity: None   Other Topics Concern  . None   Social History Narrative  . None       REVIEW OF SYSTEMS - PERTINENT POSITIVES ONLY: Review of  Systems - General ROS: negative for - chills or fever Respiratory ROS: no cough, shortness of breath, or wheezing Cardiovascular ROS: no chest pain or dyspnea on exertion Gastrointestinal ROS: positive for - abdominal pain, constipation and gas/bloating negative for - blood in stools, diarrhea or melena Genito-Urinary ROS: no dysuria, trouble voiding, or hematuria  EXAM: Filed Vitals:   03/10/13 0630  BP: 108/57  Pulse: 76  Temp: 98.1 F (36.7 C)  Resp: 18    General appearance: alert and cooperative Resp: clear to auscultation bilaterally Cardio: regular rate and rhythm GI: normal findings: soft, tender in mid epigastric region and RUQ, neg Murphy sign  Extremities: extremities normal, atraumatic, no cyanosis or edema   LABORATORY RESULTS: Lab Results  Component Value Date   ALT 53* 03/10/2013   AST 30 03/10/2013   ALKPHOS 94 03/10/2013   BILITOT 0.6 03/10/2013   Lipase 87 from 150's  Lab Results  Component Value Date   WBC 9.2 03/10/2013   HGB 11.4* 03/10/2013   HCT 33.9* 03/10/2013   MCV 86.7 03/10/2013   PLT 301 03/10/2013  RADIOLOGY RESULTS:   Images and reports are reviewed. RUQ US Gallbladder: Dependent gallstone noted, largest 1 cm. No gallbladder wall  thickening, pericholecystic fluid, or sonographic Murphy sign.  Common bile duct diameter: 4 mm. Normal.   ASSESSMENT AND PLAN:  Kimberly Farrell is a 57 y.o. F with no PMH or PSH who presents to the hospital with mild pancreatitis.  Her symptoms could be related to her gallstones and statistically this should be the cause of her pacreatitis, but I do not see [place] evidence that she has recently passed a stone.  Her physical exam is impressive for RUQ pain to palpation.   I do think her gallbladder should be resected at some point in the near future.  I have offered to do this today, if she'd like.  She is aware that there is a small chance this will not resolve her symptoms.    The anatomy & physiology of  hepatobiliary & pancreatic function was discussed.  The pathophysiology of gallbladder dysfunction was discussed.  Natural history risks without surgery was discussed.   I feel the risks of no intervention will lead to serious problems that outweigh the operative risks; therefore, I recommended cholecystectomy to remove the pathology.  I explained laparoscopic techniques with possible need for an open approach.  Probable cholangiogram to evaluate the bilary tract was explained as well.    Risks such as bleeding, infection, abscess, leak, injury to other organs, need for further treatment, heart attack, death, and other risks were discussed.  I noted a good likelihood this will help address the problem.  Possibility that this will not correct all abdominal symptoms was explained.  Goals of post-operative recovery were discussed as well.  We will work to minimize complications.  An educational handout further explaining the pathology and treatment options was given as well.  Questions were answered.  The patient expresses understanding & wishes to proceed with surgery.  Discussed Pt's diagnosis with her husband as well.  They have elected to proceed with surgery.  Vanita Panda, MD Colon and Rectal Surgery / General Surgery [facility]      Visit Diagnoses: 1. Acute pancreatitis   2. Gall stones     Primary Care Physician: Leanor Rubenstein, MD

## 2013-03-10 NOTE — Transfer of Care (Signed)
Immediate Anesthesia Transfer of Care Note  Patient: Kimberly Farrell  Procedure(s) Performed: Procedure(s): LAPAROSCOPIC CHOLECYSTECTOMY WITH INTRAOPERATIVE CHOLANGIOGRAM (N/A)  Patient Location: PACU  Anesthesia Type:General  Level of Consciousness: awake, oriented, patient cooperative, lethargic and responds to stimulation  Airway & Oxygen Therapy: Patient Spontanous Breathing and Patient connected to face mask oxygen  Post-op Assessment: Report given to PACU RN, Post -op Vital signs reviewed and stable and Patient moving all extremities  Post vital signs: Reviewed and stable  Complications: No apparent anesthesia complications

## 2013-03-10 NOTE — Preoperative (Signed)
Beta Blockers   Reason not to administer Beta Blockers:Not Applicable 

## 2013-03-11 ENCOUNTER — Encounter (HOSPITAL_COMMUNITY): Payer: Self-pay | Admitting: General Surgery

## 2013-03-11 DIAGNOSIS — K859 Acute pancreatitis without necrosis or infection, unspecified: Secondary | ICD-10-CM

## 2013-03-11 DIAGNOSIS — K8020 Calculus of gallbladder without cholecystitis without obstruction: Secondary | ICD-10-CM

## 2013-03-11 LAB — LIPASE, BLOOD: Lipase: 42 U/L (ref 11–59)

## 2013-03-11 MED ORDER — IBUPROFEN 600 MG PO TABS
600.0000 mg | ORAL_TABLET | Freq: Four times a day (QID) | ORAL | Status: DC | PRN
  Filled 2013-03-11: qty 1

## 2013-03-11 NOTE — Progress Notes (Signed)
General Surgery [facility]  See note from earlier today.  Velora Heckler, MD, [facility] Office: [phone]

## 2013-03-11 NOTE — Progress Notes (Signed)
I have personally taken an interval history, reviewed the chart, and examined the patient.  I agree with the extender's note, impression and recommendations.  Robert D. Kaplan, MD, FACG [facility] [phone]  

## 2013-03-11 NOTE — Progress Notes (Addendum)
1 Day Post-Op  Subjective: She has had a few clears, not really wanting to up diet yet.  Very sore.  Husband very anxious, she should be in good condition before going home.  Objective: Vital signs in last 24 hours: Temp:  [98 F (36.7 C)-98.7 F (37.1 C)] 98 F (36.7 C) (10/06 0500) Pulse Rate:  [62-101] 76 (10/06 0500) Resp:  [12-17] 16 (10/06 0500) BP: (105-145)/(65-90) 105/69 mmHg (10/06 0500) SpO2:  [96 %-100 %] 97 % (10/06 0500) Last BM Date: 03/07/13 Diet: clears Afebrile, VSS Lipase down to 42,  IOC is negative Intake/Output from previous day: 10/05 0701 - 10/06 0700 In: 2625.4 [I.V.:2625.4] Out: -  Intake/Output this shift:    General appearance: alert, cooperative and no distress GI: soft, very tender, incisions look fine.  few BS, no distension.  Lab Results:   Recent Labs  03/09/13 1915 03/10/13 0510  WBC 10.3 9.2  HGB 12.6 11.4*  HCT 37.5 33.9*  PLT 340 301    BMET  Recent Labs  03/09/13 1915 03/10/13 0510  NA 139 139  K 4.4 3.9  CL 102 106  CO2 28 24  GLUCOSE 103* 97  BUN 13 8  CREATININE 0.92 0.78  CALCIUM 9.3 8.3*   PT/INR No results found for this basename: LABPROT, INR,  in the last 72 hours   Recent Labs Lab 03/09/13 1915 03/10/13 0510  AST 43* 30  ALT 68* 53*  ALKPHOS 107 94  BILITOT 0.3 0.6  PROT 7.5 6.3  ALBUMIN 3.3* 2.6*     Lipase     Component Value Date/Time   LIPASE 42 03/11/2013 0520     Studies/Results: Dg Cholangiogram Operative  03/10/2013   *RADIOLOGY REPORT*  Clinical Data: Laparoscopic cholecystectomy  INTRAOPERATIVE CHOLANGIOGRAM  Technique:  Multiple fluoroscopic spot radiographs were obtained during intraoperative cholangiogram and are submitted for interpretation post-operatively.  Comparison: Abdominal ultrasound - 03/09/2013  Findings:  Intraoperative angiographic images of the right upper abdominal quadrant during laparoscopic cholecystectomy are provided for review.  Surgical clips overlie the  expected location of the gallbladder fossa.  Contrast injection demonstrates selective cannulation of the central aspect of the cystic duct.  There is passage of contrast through the central aspect of the cystic duct with filling of a non-dilated common bile duct.  There is passage of contrast though the CBD and into the descending portion of the duodenum.  There is minimal reflux of injected contrast into the common hepatic duct and central aspect of the nondilated intrahepatic biliary system.  There are no discrete filling defects within the opacified portions of the biliary system to suggest the presence of choledocholithiasis.  IMPRESSION:  Intraoperative cholangiogram as above.  No discrete filling defects to suggest the presence of choledocholithiasis.   Original Report Authenticated By: Kimberly Ruiz, MD   US Abdomen Complete  03/09/2013   CLINICAL DATA:  Abdominal pain and nausea  EXAM: ULTRASOUND ABDOMEN COMPLETE  COMPARISON:  CT 10/17/2005  FINDINGS: Gallbladder  Dependent gallstone noted, largest 1 cm. No gallbladder wall thickening, pericholecystic fluid, or sonographic Murphy sign.  Common bile duct  Diameter: 4 mm. Normal.  Liver  No focal lesion identified. Within normal limits in parenchymal echogenicity.  IVC  No abnormality visualized.  Pancreas  Visualized portion unremarkable.  Spleen  Size and appearance within normal limits.  Right Kidney  Length: 11.0 cm. Extra renal pelvis reidentified. Echogenicity within normal limits. No mass or hydronephrosis visualized.  Left Kidney  Length: 11.0 cm. Extra renal  pelvis reidentified. Echogenicity within normal limits. No mass or hydronephrosis visualized.  Abdominal aorta  No aneurysm visualized.  IMPRESSION: Gallstone without other sonographic evidence for acute cholecystitis. No acute intra-abdominal abnormality.   Electronically Signed   By: Kimberly Farrell M.D.   On: 03/09/2013 21:00    Medications: . heparin  5,000 Units Subcutaneous Q8H     Assessment/Plan Acute pancreatitis Gall stones S/p LAPAROSCOPIC CHOLECYSTECTOMY WITH INTRAOPERATIVE CHOLANGIOGRAM, 03/10/2013, Kimberly Levee, MD.   Plan:  Advance diet as tolerated, mobilize, PO pain meds.  Home today or in AM when she can tolerate regular diet, and pain with po meds.  Pt took IV pain meds, nothing PO for pain yet, her and her husband are very reluctant to go home, now.  I have ask the nurse to try her on oral meds and mobilize.        LOS: 2 days    Kimberly Farrell 03/11/2013

## 2013-03-11 NOTE — Progress Notes (Signed)
IOC normal.  Lipase normalized and LFT's almost normal.  No need for ERCP.  Will sign off.

## 2013-03-11 NOTE — Progress Notes (Signed)
General Surgery [facility]  Patient seen and examined.  POD#1 from lap chole.  Mild abdominal pain.  Encouraged ambulation.  Likely home later today.  Velora Heckler, MD, [facility] Office: [phone]

## 2013-03-12 ENCOUNTER — Encounter: Payer: Self-pay | Admitting: General Surgery

## 2013-03-12 MED ORDER — ACETAMINOPHEN 325 MG PO TABS: ORAL_TABLET | ORAL | Status: DC

## 2013-03-12 MED ORDER — IBUPROFEN 200 MG PO TABS: ORAL_TABLET | ORAL | Status: DC

## 2013-03-12 MED ORDER — OXYCODONE-ACETAMINOPHEN 5-325 MG PO TABS: 1.0000 | ORAL_TABLET | ORAL | Status: DC | PRN

## 2013-03-12 NOTE — Discharge Summary (Signed)
Physician Discharge Summary  Patient ID: Kimberly Farrell MRN: [id number] DOB/AGE: Aug 01, 1955 57 y.o.  Admit date: 03/09/2013 Discharge date: 03/12/2013  Admission Diagnoses:  Acute pancreatitis  Gall stones    Discharge Diagnoses:  Principal Problem:   Acute pancreatitis Active Problems:   Gall stones   PROCEDURES: S/p LAPAROSCOPIC CHOLECYSTECTOMY WITH INTRAOPERATIVE CHOLANGIOGRAM, 03/10/2013, [name], MD.     Hospital Course: Kimberly Farrell is a 57 y.o. female who presents to the hospital with mid-epigastric pain that radiates to her back. She states that she has had this pain in the past about 2 months ago but it went away rather quickly. Pain got worse with eating a big salad yesterday. The pain worsened almost immediately after she started eating. This current episode has been occuring since Wed. She has had some minimal nausea and bloating. She has no h/o alcohol use.  Pt was taken to the OR that afternoon, and tolerated the procedure well.  Her diet was advanced and she was mobilized the first day. She was very tender post op, but was ready for d/c the following AM.  Incisions looks good.  She was discharged home on the 2nd post op morning.   Disposition: 01-Home or Self Care     Medication List    STOP taking these medications       chlorpheniramine-HYDROcodone 10-8 MG/5ML Lqcr  Commonly known as:  TUSSIONEX      TAKE these medications       acetaminophen 325 MG tablet  Commonly known as:  TYLENOL  Do not take more than 4000 mg of tylenol (acetaminophen) in any 24 hour period.  You have tylenol in your prescription medication.     bismuth subsalicylate 262 MG/15ML suspension  Commonly known as:  PEPTO BISMOL  Take 15 mLs by mouth every 6 (six) hours as needed for indigestion.     HYDROcodone-homatropine 5-1.5 MG/5ML syrup  Commonly known as:  HYCODAN  Take 5 mLs by mouth every 4 (four) hours as needed for cough.     ibuprofen 200 MG tablet  Commonly  known as:  ADVIL,MOTRIN  You can take 2-3 tablets every 6 hours as needed for pain, or you can use tylenol.     oxyCODONE-acetaminophen 5-325 MG per tablet  Commonly known as:  PERCOCET/ROXICET  Take 1-2 tablets by mouth every 4 (four) hours as needed.           Follow-up Information   Follow up with Vanita Panda., MD In 2 weeks.   Specialty:  General Surgery   Contact information:   [address] [place] [address] [phone]       Signed: Sherrie George 03/12/2013, 10:52 AM

## 2013-03-12 NOTE — Progress Notes (Signed)
Patient given information regarding My Chart but not interested in activating while in the hospital.

## 2013-03-12 NOTE — Discharge Summary (Signed)
General Surgery [facility]  Agree with summary and plans for follow up.  Velora Heckler, MD, [facility] Office: [phone]

## 2013-03-12 NOTE — Progress Notes (Signed)
2 Days Post-Op  Subjective: Feels better this AM, tolerating diet, she says she can walk.  Objective: Vital signs in last 24 hours: Temp:  [98.7 F (37.1 C)-99.2 F (37.3 C)] 98.7 F (37.1 C) (10/07 0601) Pulse Rate:  [72-77] 72 (10/07 0601) Resp:  [16-18] 16 (10/07 0601) BP: (106-160)/(61-83) 160/67 mmHg (10/07 0601) SpO2:  [97 %-99 %] 97 % (10/07 0601) Last BM Date: 03/07/13 480 PO, TM 99.2, VSS NO labs Intake/Output from previous day: 10/06 0701 - 10/07 0700 In: 620 [P.O.:480; I.V.:140] Out: -  Intake/Output this shift:    General appearance: alert, cooperative and no distress GI: soft, sore, +BS, incisions look fine.  Lab Results:   Recent Labs  03/09/13 1915 03/10/13 0510  WBC 10.3 9.2  HGB 12.6 11.4*  HCT 37.5 33.9*  PLT 340 301    BMET  Recent Labs  03/09/13 1915 03/10/13 0510  NA 139 139  K 4.4 3.9  CL 102 106  CO2 28 24  GLUCOSE 103* 97  BUN 13 8  CREATININE 0.92 0.78  CALCIUM 9.3 8.3*   PT/INR No results found for this basename: LABPROT, INR,  in the last 72 hours   Recent Labs Lab 03/09/13 1915 03/10/13 0510  AST 43* 30  ALT 68* 53*  ALKPHOS 107 94  BILITOT 0.3 0.6  PROT 7.5 6.3  ALBUMIN 3.3* 2.6*     Lipase     Component Value Date/Time   LIPASE 42 03/11/2013 0520     Studies/Results: Dg Cholangiogram Operative  03/10/2013   *RADIOLOGY REPORT*  Clinical Data: Laparoscopic cholecystectomy  INTRAOPERATIVE CHOLANGIOGRAM  Technique:  Multiple fluoroscopic spot radiographs were obtained during intraoperative cholangiogram and are submitted for interpretation post-operatively.  Comparison: Abdominal ultrasound - 03/09/2013  Findings:  Intraoperative angiographic images of the right upper abdominal quadrant during laparoscopic cholecystectomy are provided for review.  Surgical clips overlie the expected location of the gallbladder fossa.  Contrast injection demonstrates selective cannulation of the central aspect of the cystic duct.   There is passage of contrast through the central aspect of the cystic duct with filling of a non-dilated common bile duct.  There is passage of contrast though the CBD and into the descending portion of the duodenum.  There is minimal reflux of injected contrast into the common hepatic duct and central aspect of the nondilated intrahepatic biliary system.  There are no discrete filling defects within the opacified portions of the biliary system to suggest the presence of choledocholithiasis.  IMPRESSION:  Intraoperative cholangiogram as above.  No discrete filling defects to suggest the presence of choledocholithiasis.   Original Report Authenticated By: Tacey Ruiz, MD    Medications: . heparin  5,000 Units Subcutaneous Q8H    Assessment/Plan Acute pancreatitis  Gall stones  S/p LAPAROSCOPIC CHOLECYSTECTOMY WITH INTRAOPERATIVE CHOLANGIOGRAM, 03/10/2013, [name], MD.    Plan:  Home today.      LOS: 3 days    Breyanna Valera 03/12/2013

## 2013-03-12 NOTE — Progress Notes (Signed)
General Surgery [facility]  Doing well.  Anticipate discharge home today.  Velora Heckler, MD, [facility] Office: [phone]

## 2013-04-02 ENCOUNTER — Encounter (INDEPENDENT_AMBULATORY_CARE_PROVIDER_SITE_OTHER): Payer: Self-pay | Admitting: General Surgery

## 2013-04-02 ENCOUNTER — Ambulatory Visit (INDEPENDENT_AMBULATORY_CARE_PROVIDER_SITE_OTHER): Payer: Managed Care, Other (non HMO) | Admitting: General Surgery

## 2013-04-02 VITALS — BP 108/64 | HR 66 | Temp 97.0°F | Resp 14 | Ht 59.0 in | Wt 121.0 lb

## 2013-04-02 DIAGNOSIS — Z9889 Other specified postprocedural states: Secondary | ICD-10-CM

## 2013-04-02 NOTE — Patient Instructions (Signed)
Call the office if you have any concerns.

## 2013-04-02 NOTE — Progress Notes (Signed)
Kimberly Farrell is a 57 y.o. female who is status post a lap cholecystectomy on 10/5.  She is doing well.  Her pain is resolved.  She is having regular BM's and no nausea.    Objective: Filed Vitals:   04/02/13 1008  BP: 108/64  Pulse: 66  Temp: 97 F (36.1 C)  Resp: 14    General appearance: alert and cooperative GI: normal findings: soft, non-tender  Incision: healing well   Assessment: s/p  There are no active problems to display for this patient.   Plan: Doing well after cholecystectomy.  RTO PRN.  No heavy lifting for 8 weeks after surgery.     Vanita Panda, MD [facility], [place] [phone]   04/02/2013 10:15 AM

## 2013-04-09 ENCOUNTER — Other Ambulatory Visit (HOSPITAL_COMMUNITY): Payer: Self-pay | Admitting: Family Medicine

## 2013-04-09 DIAGNOSIS — Z1231 Encounter for screening mammogram for malignant neoplasm of breast: Secondary | ICD-10-CM

## 2013-04-22 ENCOUNTER — Ambulatory Visit (HOSPITAL_COMMUNITY)
Admission: RE | Admit: 2013-04-22 | Discharge: 2013-04-22 | Disposition: A | Discharge status: Home / Self Care | Payer: Managed Care, Other (non HMO) | Source: Ambulatory Visit | Attending: Family Medicine | Admitting: Family Medicine

## 2013-04-22 DIAGNOSIS — Z1231 Encounter for screening mammogram for malignant neoplasm of breast: Secondary | ICD-10-CM | POA: Insufficient documentation

## 2013-06-12 ENCOUNTER — Other Ambulatory Visit (HOSPITAL_COMMUNITY)
Admission: RE | Admit: 2013-06-12 | Discharge: 2013-06-12 | Disposition: A | Discharge status: Home / Self Care | Payer: Managed Care, Other (non HMO) | Source: Ambulatory Visit | Attending: Family Medicine | Admitting: Family Medicine

## 2013-06-12 ENCOUNTER — Other Ambulatory Visit: Payer: Self-pay | Admitting: Family Medicine

## 2013-06-12 DIAGNOSIS — Z01419 Encounter for gynecological examination (general) (routine) without abnormal findings: Secondary | ICD-10-CM | POA: Insufficient documentation

## 2014-05-07 ENCOUNTER — Other Ambulatory Visit (HOSPITAL_COMMUNITY): Payer: Self-pay | Admitting: Family Medicine

## 2014-05-07 DIAGNOSIS — Z1231 Encounter for screening mammogram for malignant neoplasm of breast: Secondary | ICD-10-CM

## 2014-05-14 ENCOUNTER — Ambulatory Visit (HOSPITAL_COMMUNITY)
Admission: RE | Admit: 2014-05-14 | Discharge: 2014-05-14 | Disposition: A | Discharge status: Home / Self Care | Payer: Managed Care, Other (non HMO) | Source: Ambulatory Visit | Attending: Family Medicine | Admitting: Family Medicine

## 2014-05-14 ENCOUNTER — Other Ambulatory Visit (HOSPITAL_COMMUNITY): Payer: Self-pay | Admitting: Family Medicine

## 2014-05-14 DIAGNOSIS — Z1231 Encounter for screening mammogram for malignant neoplasm of breast: Secondary | ICD-10-CM | POA: Insufficient documentation

## 2015-04-06 ENCOUNTER — Other Ambulatory Visit: Payer: Self-pay

## 2015-04-06 DIAGNOSIS — Z1231 Encounter for screening mammogram for malignant neoplasm of breast: Secondary | ICD-10-CM

## 2015-07-01 ENCOUNTER — Other Ambulatory Visit: Payer: Self-pay

## 2015-07-01 DIAGNOSIS — Z1231 Encounter for screening mammogram for malignant neoplasm of breast: Secondary | ICD-10-CM

## 2015-07-13 ENCOUNTER — Ambulatory Visit
Admission: RE | Admit: 2015-07-13 | Discharge: 2015-07-13 | Disposition: A | Discharge status: Home / Self Care | Payer: BLUE CROSS/BLUE SHIELD | Source: Ambulatory Visit

## 2015-07-13 DIAGNOSIS — Z1231 Encounter for screening mammogram for malignant neoplasm of breast: Secondary | ICD-10-CM

## 2015-07-30 ENCOUNTER — Other Ambulatory Visit: Payer: Self-pay | Admitting: Family Medicine

## 2015-07-30 ENCOUNTER — Other Ambulatory Visit (HOSPITAL_COMMUNITY)
Admission: RE | Admit: 2015-07-30 | Discharge: 2015-07-30 | Disposition: A | Discharge status: Home / Self Care | Payer: BLUE CROSS/BLUE SHIELD | Source: Ambulatory Visit | Attending: Family Medicine | Admitting: Family Medicine

## 2015-07-30 DIAGNOSIS — Z124 Encounter for screening for malignant neoplasm of cervix: Secondary | ICD-10-CM | POA: Insufficient documentation

## 2015-08-03 LAB — CYTOLOGY - PAP

## 2015-11-17 DIAGNOSIS — H40033 Anatomical narrow angle, bilateral: Secondary | ICD-10-CM | POA: Diagnosis not present

## 2015-12-01 DIAGNOSIS — H40233 Intermittent angle-closure glaucoma, bilateral: Secondary | ICD-10-CM | POA: Diagnosis not present

## 2016-07-06 DIAGNOSIS — F411 Generalized anxiety disorder: Secondary | ICD-10-CM | POA: Diagnosis not present

## 2016-07-06 DIAGNOSIS — M545 Low back pain: Secondary | ICD-10-CM | POA: Diagnosis not present

## 2016-07-11 ENCOUNTER — Other Ambulatory Visit: Payer: Self-pay | Admitting: Family Medicine

## 2016-07-11 DIAGNOSIS — Z1231 Encounter for screening mammogram for malignant neoplasm of breast: Secondary | ICD-10-CM

## 2016-07-21 ENCOUNTER — Ambulatory Visit
Admission: RE | Admit: 2016-07-21 | Discharge: 2016-07-21 | Disposition: A | Discharge status: Home / Self Care | Payer: BLUE CROSS/BLUE SHIELD | Source: Ambulatory Visit | Attending: Family Medicine | Admitting: Family Medicine

## 2016-07-21 DIAGNOSIS — Z1231 Encounter for screening mammogram for malignant neoplasm of breast: Secondary | ICD-10-CM

## 2016-08-26 DIAGNOSIS — J069 Acute upper respiratory infection, unspecified: Secondary | ICD-10-CM | POA: Diagnosis not present

## 2016-08-26 DIAGNOSIS — R109 Unspecified abdominal pain: Secondary | ICD-10-CM | POA: Diagnosis not present

## 2016-08-26 DIAGNOSIS — R52 Pain, unspecified: Secondary | ICD-10-CM | POA: Diagnosis not present

## 2016-08-31 DIAGNOSIS — G8929 Other chronic pain: Secondary | ICD-10-CM | POA: Diagnosis not present

## 2016-08-31 DIAGNOSIS — M4316 Spondylolisthesis, lumbar region: Secondary | ICD-10-CM | POA: Diagnosis not present

## 2016-08-31 DIAGNOSIS — M545 Low back pain: Secondary | ICD-10-CM | POA: Diagnosis not present

## 2016-09-05 DIAGNOSIS — Z0000 Encounter for general adult medical examination without abnormal findings: Secondary | ICD-10-CM | POA: Diagnosis not present

## 2016-09-05 DIAGNOSIS — F411 Generalized anxiety disorder: Secondary | ICD-10-CM | POA: Diagnosis not present

## 2016-09-05 DIAGNOSIS — Z13220 Encounter for screening for lipoid disorders: Secondary | ICD-10-CM | POA: Diagnosis not present

## 2016-09-05 DIAGNOSIS — Z124 Encounter for screening for malignant neoplasm of cervix: Secondary | ICD-10-CM | POA: Diagnosis not present

## 2016-09-05 DIAGNOSIS — E559 Vitamin D deficiency, unspecified: Secondary | ICD-10-CM | POA: Diagnosis not present

## 2016-09-05 DIAGNOSIS — M8588 Other specified disorders of bone density and structure, other site: Secondary | ICD-10-CM | POA: Diagnosis not present

## 2017-04-05 DIAGNOSIS — M545 Low back pain: Secondary | ICD-10-CM | POA: Diagnosis not present

## 2017-04-05 DIAGNOSIS — M4316 Spondylolisthesis, lumbar region: Secondary | ICD-10-CM | POA: Diagnosis not present

## 2017-04-05 DIAGNOSIS — G8929 Other chronic pain: Secondary | ICD-10-CM | POA: Diagnosis not present

## 2017-04-20 DIAGNOSIS — H401131 Primary open-angle glaucoma, bilateral, mild stage: Secondary | ICD-10-CM | POA: Diagnosis not present

## 2017-07-20 ENCOUNTER — Other Ambulatory Visit: Payer: Self-pay | Admitting: Family Medicine

## 2017-07-20 DIAGNOSIS — Z1231 Encounter for screening mammogram for malignant neoplasm of breast: Secondary | ICD-10-CM

## 2017-08-09 ENCOUNTER — Ambulatory Visit
Admission: RE | Admit: 2017-08-09 | Discharge: 2017-08-09 | Disposition: A | Discharge status: Home / Self Care | Payer: BLUE CROSS/BLUE SHIELD | Source: Ambulatory Visit | Attending: Family Medicine | Admitting: Family Medicine

## 2017-08-09 DIAGNOSIS — Z1231 Encounter for screening mammogram for malignant neoplasm of breast: Secondary | ICD-10-CM | POA: Diagnosis not present

## 2017-09-06 ENCOUNTER — Other Ambulatory Visit: Payer: Self-pay | Admitting: Family Medicine

## 2017-09-06 ENCOUNTER — Other Ambulatory Visit (HOSPITAL_COMMUNITY)
Admission: RE | Admit: 2017-09-06 | Discharge: 2017-09-06 | Disposition: A | Discharge status: Home / Self Care | Payer: BLUE CROSS/BLUE SHIELD | Source: Ambulatory Visit | Attending: Family Medicine | Admitting: Family Medicine

## 2017-09-06 DIAGNOSIS — F341 Dysthymic disorder: Secondary | ICD-10-CM | POA: Diagnosis not present

## 2017-09-06 DIAGNOSIS — M791 Myalgia, unspecified site: Secondary | ICD-10-CM | POA: Diagnosis not present

## 2017-09-06 DIAGNOSIS — Z124 Encounter for screening for malignant neoplasm of cervix: Secondary | ICD-10-CM | POA: Insufficient documentation

## 2017-09-06 DIAGNOSIS — M2141 Flat foot [pes planus] (acquired), right foot: Secondary | ICD-10-CM | POA: Diagnosis not present

## 2017-09-06 DIAGNOSIS — F439 Reaction to severe stress, unspecified: Secondary | ICD-10-CM | POA: Diagnosis not present

## 2017-09-06 DIAGNOSIS — M2142 Flat foot [pes planus] (acquired), left foot: Secondary | ICD-10-CM | POA: Diagnosis not present

## 2017-09-06 DIAGNOSIS — Z13220 Encounter for screening for lipoid disorders: Secondary | ICD-10-CM | POA: Diagnosis not present

## 2017-09-06 DIAGNOSIS — Z0000 Encounter for general adult medical examination without abnormal findings: Secondary | ICD-10-CM | POA: Diagnosis not present

## 2017-09-07 LAB — CYTOLOGY - PAP: Diagnosis: NEGATIVE

## 2017-10-18 DIAGNOSIS — M5136 Other intervertebral disc degeneration, lumbar region: Secondary | ICD-10-CM | POA: Diagnosis not present

## 2017-10-18 DIAGNOSIS — M4316 Spondylolisthesis, lumbar region: Secondary | ICD-10-CM | POA: Diagnosis not present

## 2017-11-03 DIAGNOSIS — J029 Acute pharyngitis, unspecified: Secondary | ICD-10-CM | POA: Diagnosis not present

## 2017-11-03 DIAGNOSIS — R05 Cough: Secondary | ICD-10-CM | POA: Diagnosis not present

## 2017-11-03 DIAGNOSIS — R52 Pain, unspecified: Secondary | ICD-10-CM | POA: Diagnosis not present

## 2017-12-01 DIAGNOSIS — H401132 Primary open-angle glaucoma, bilateral, moderate stage: Secondary | ICD-10-CM | POA: Diagnosis not present

## 2017-12-04 DIAGNOSIS — H401132 Primary open-angle glaucoma, bilateral, moderate stage: Secondary | ICD-10-CM | POA: Diagnosis not present

## 2018-04-04 DIAGNOSIS — M5416 Radiculopathy, lumbar region: Secondary | ICD-10-CM | POA: Diagnosis not present

## 2018-04-14 DIAGNOSIS — M5136 Other intervertebral disc degeneration, lumbar region: Secondary | ICD-10-CM | POA: Diagnosis not present

## 2018-04-18 DIAGNOSIS — G894 Chronic pain syndrome: Secondary | ICD-10-CM | POA: Diagnosis not present

## 2018-05-03 ENCOUNTER — Emergency Department (HOSPITAL_COMMUNITY)
Admission: EM | Admit: 2018-05-03 | Discharge: 2018-05-03 | Disposition: A | Discharge status: Home / Self Care | Payer: BLUE CROSS/BLUE SHIELD | Attending: Emergency Medicine | Admitting: Emergency Medicine

## 2018-05-03 ENCOUNTER — Encounter (HOSPITAL_COMMUNITY): Payer: Self-pay | Admitting: Emergency Medicine

## 2018-05-03 DIAGNOSIS — S61211A Laceration without foreign body of left index finger without damage to nail, initial encounter: Secondary | ICD-10-CM | POA: Diagnosis not present

## 2018-05-03 DIAGNOSIS — Y999 Unspecified external cause status: Secondary | ICD-10-CM | POA: Insufficient documentation

## 2018-05-03 DIAGNOSIS — W260XXA Contact with knife, initial encounter: Secondary | ICD-10-CM | POA: Diagnosis not present

## 2018-05-03 DIAGNOSIS — Y929 Unspecified place or not applicable: Secondary | ICD-10-CM | POA: Diagnosis not present

## 2018-05-03 DIAGNOSIS — Z23 Encounter for immunization: Secondary | ICD-10-CM | POA: Diagnosis not present

## 2018-05-03 DIAGNOSIS — Y93G1 Activity, food preparation and clean up: Secondary | ICD-10-CM | POA: Insufficient documentation

## 2018-05-03 MED ORDER — TETANUS-DIPHTH-ACELL PERTUSSIS 5-2.5-18.5 LF-MCG/0.5 IM SUSP
0.5000 mL | Freq: Once | INTRAMUSCULAR | Status: AC
  Administered 2018-05-03: 0.5 mL via INTRAMUSCULAR
  Filled 2018-05-03: qty 0.5

## 2018-05-03 MED ORDER — LIDOCAINE HCL (PF) 1 % IJ SOLN
5.0000 mL | Freq: Once | INTRAMUSCULAR | Status: AC
  Administered 2018-05-03: 5 mL

## 2018-05-03 NOTE — ED Triage Notes (Signed)
Pt was cutting [place] and cut left index finger with knife.

## 2018-05-03 NOTE — ED Notes (Signed)
Dressing applied to patient's finger and d/c instructions reviewed

## 2018-05-03 NOTE — Discharge Instructions (Signed)
1. Medications: Tylenol or ibuprofen for pain, usual home medications 2. Treatment: ice for swelling, keep wound clean with warm soap and water and keep bandage dry 3. Follow Up: Please return in 10 days to have your stitches removed or sooner if you have concerns. Return to the emergency department for increased redness, drainage of pus from the wound   WOUND CARE  Keep area clean and dry for 24 hours  After 24 hours, remove bandage and wash wound gently with mild soap and warm water. Reapply a new bandage after cleaning wound.   Continue daily cleansing with soap and water until stitches are removed.  Do not apply any ointments or creams to the wound while stitches are in place, as this may cause delayed healing. Return if you experience any of the following signs of infection: Swelling, redness, pus drainage, streaking, fever >101.0 F  Return if you experience excessive bleeding that does not stop after 15-20 minutes of constant, firm pressure.

## 2018-05-03 NOTE — ED Notes (Signed)
ED Provider at bedside. 

## 2018-05-03 NOTE — ED Provider Notes (Signed)
[facility]-EMERGENCY DEPT Provider Note   CSN: [id number] Arrival date & time: 05/03/18  1405     History   Chief Complaint Chief Complaint  Patient presents with  . Laceration    HPI Kimberly Farrell is a 62 y.o. female resenting for evaluation of finger laceration.  Patient states just prior to arrival she was cutting a [place] when she slipped and cut the dorsal aspect of her left index finger.  She reports mild throbbing at the area.  She denies numbness or tingling.  She is not on blood thinners.  She denies injury elsewhere.  She has not taken anything for pain.  She is not sure when her last tetanus shot was, thinks it may have been more than 5, but possibly more than 10 years ago.   HPI  History reviewed. No pertinent past medical history.  There are no active problems to display for this patient.   Past Surgical History:  Procedure Laterality Date  . CHOLECYSTECTOMY N/A 03/10/2013   Procedure: LAPAROSCOPIC CHOLECYSTECTOMY WITH INTRAOPERATIVE CHOLANGIOGRAM;  Surgeon: [name], MD;  Location: [facility]S;  Service: General;  Laterality: N/A;     OB History   None      Home Medications    Prior to Admission medications   Medication Sig Start Date End Date Taking? Authorizing Provider  calcium acetate (PHOSLO) 667 MG capsule Take 667 mg by mouth 3 (three) times daily with meals.    [provider]  cholecalciferol (VITAMIN D) 1000 UNITS tablet Take 1,000 Units by mouth daily.    [provider]    Family History No family history on file.  Social History Social History   Tobacco Use  . Smoking status: Never Smoker  . Smokeless tobacco: Never Used  Substance Use Topics  . Alcohol use: No  . Drug use: No     Allergies   Patient has no known allergies.   Review of Systems Review of Systems  Skin: Positive for wound.  Neurological: Negative for numbness.     Physical Exam Updated Vital Signs BP 129/89 (BP  Location: Right Arm)   Pulse 75   Temp 98.2 F (36.8 C) (Oral)   Resp 14   Ht 4\' 11"  (1.499 m)   Wt 59 kg   SpO2 100%   BMI 26.26 kg/m   Physical Exam  Constitutional: She is oriented to person, place, and time. She appears well-developed and well-nourished. No distress.  HENT:  Head: Normocephalic and atraumatic.  Eyes: EOM are normal.  Neck: Normal range of motion.  Pulmonary/Chest: Effort normal.  Abdominal: She exhibits no distension.  Musculoskeletal: Normal range of motion.  Full active range of motion of the left index finger.  Strength against resistance intact.  Good cap refill.  Sensation intact.  Neurological: She is alert and oriented to person, place, and time. No sensory deficit.  Skin: Skin is warm. Capillary refill takes less than 2 seconds. No rash noted.  1 cm laceration of the dorsal left index finger overlying the PIP  Psychiatric: She has a normal mood and affect.  Nursing note and vitals reviewed.    ED Treatments / Results  Labs (all labs ordered are listed, but only abnormal results are displayed) Labs Reviewed - No data to display  EKG None  Radiology No results found.  Procedures .Marland KitchenLaceration Repair Date/Time: 05/03/2018 3:23 PM Performed by: Alveria Apley, PA-C Authorized by: Alveria Apley, PA-C   Consent:    Consent obtained:  Verbal   Consent given by:  Patient   Risks discussed:  Infection, pain, poor cosmetic result and poor wound healing Anesthesia (see MAR for exact dosages):    Anesthesia method:  Local infiltration   Local anesthetic:  Lidocaine 1% w/o epi Laceration details:    Location:  Finger   Finger location:  L index finger   Length (cm):  1   Depth (mm):  3 Repair type:    Repair type:  Simple Pre-procedure details:    Preparation:  Patient was prepped and draped in usual sterile fashion Exploration:    Wound exploration: wound explored through full range of motion and entire depth of wound probed and  visualized     Wound extent: no foreign bodies/material noted, no muscle damage noted, no nerve damage noted, no tendon damage noted and no vascular damage noted   Treatment:    Area cleansed with:  Saline   Amount of cleaning:  Standard   Irrigation solution:  Sterile saline   Irrigation method:  Syringe Skin repair:    Repair method:  Sutures   Suture size:  4-0   Suture material:  Prolene   Suture technique:  Simple interrupted   Number of sutures:  2 Post-procedure details:    Dressing:  Bulky dressing   Patient tolerance of procedure:  Tolerated well, no immediate complications   (including critical care time)  Medications Ordered in ED Medications  lidocaine (PF) (XYLOCAINE) 1 % injection 5 mL (has no administration in time range)  Tdap (BOOSTRIX) injection 0.5 mL (0.5 mLs Intramuscular Given 05/03/18 1459)     Initial Impression / Assessment and Plan / ED Course  I have reviewed the triage vital signs and the nursing notes.  Pertinent labs & imaging results that were available during my care of the patient were reviewed by me and considered in my medical decision making (see chart for details).     Pt presenting for evaluation of finger laceration.  Physical exam reassuring, she is neurovascularly intact.  Laceration is superficial, I do not believe x-rays are necessary at this time.  Tetanus updated, as patient is unsure when her last tetanus shot was.  Wound repaired as described above.  Aftercare injections given.  At this time, patient appears safe for discharge.  Return precautions given.  Patient states she understands and agrees to plan.   Final Clinical Impressions(s) / ED Diagnoses   Final diagnoses:  Laceration of left index finger without foreign body without damage to nail, initial encounter    ED Discharge Orders    None       Alveria ApleyCaccavale, Trinnity Breunig, PA-C 05/03/18 1524    Mancel BaleWentz, Elliott, MD 05/04/18 [phone]

## 2018-05-14 DIAGNOSIS — Z4802 Encounter for removal of sutures: Secondary | ICD-10-CM | POA: Diagnosis not present

## 2018-05-14 DIAGNOSIS — S61218D Laceration without foreign body of other finger without damage to nail, subsequent encounter: Secondary | ICD-10-CM | POA: Diagnosis not present

## 2018-05-14 DIAGNOSIS — F411 Generalized anxiety disorder: Secondary | ICD-10-CM | POA: Diagnosis not present

## 2018-06-15 DIAGNOSIS — R1012 Left upper quadrant pain: Secondary | ICD-10-CM | POA: Diagnosis not present

## 2018-06-15 DIAGNOSIS — R1013 Epigastric pain: Secondary | ICD-10-CM | POA: Diagnosis not present

## 2018-06-19 ENCOUNTER — Emergency Department (HOSPITAL_COMMUNITY): Payer: BLUE CROSS/BLUE SHIELD

## 2018-06-19 ENCOUNTER — Encounter (HOSPITAL_COMMUNITY): Payer: Self-pay

## 2018-06-19 ENCOUNTER — Emergency Department (HOSPITAL_COMMUNITY)
Admission: EM | Admit: 2018-06-19 | Discharge: 2018-06-20 | Disposition: A | Discharge status: Home / Self Care | Payer: BLUE CROSS/BLUE SHIELD | Attending: Emergency Medicine | Admitting: Emergency Medicine

## 2018-06-19 DIAGNOSIS — K529 Noninfective gastroenteritis and colitis, unspecified: Secondary | ICD-10-CM | POA: Diagnosis not present

## 2018-06-19 DIAGNOSIS — R1013 Epigastric pain: Secondary | ICD-10-CM | POA: Diagnosis not present

## 2018-06-19 DIAGNOSIS — R748 Abnormal levels of other serum enzymes: Secondary | ICD-10-CM | POA: Diagnosis not present

## 2018-06-19 DIAGNOSIS — Z79899 Other long term (current) drug therapy: Secondary | ICD-10-CM | POA: Insufficient documentation

## 2018-06-19 DIAGNOSIS — R109 Unspecified abdominal pain: Secondary | ICD-10-CM | POA: Diagnosis not present

## 2018-06-19 DIAGNOSIS — R112 Nausea with vomiting, unspecified: Secondary | ICD-10-CM | POA: Diagnosis not present

## 2018-06-19 LAB — CBC
HCT: 41.7 % (ref 36.0–46.0)
Hemoglobin: 13.2 g/dL (ref 12.0–15.0)
MCH: 28.5 pg (ref 26.0–34.0)
MCHC: 31.7 g/dL (ref 30.0–36.0)
MCV: 90.1 fL (ref 80.0–100.0)
Platelets: 369 10*3/uL (ref 150–400)
RBC: 4.63 MIL/uL (ref 3.87–5.11)
RDW: 13.7 % (ref 11.5–15.5)
WBC: 9.2 10*3/uL (ref 4.0–10.5)
nRBC: 0 % (ref 0.0–0.2)

## 2018-06-19 LAB — COMPREHENSIVE METABOLIC PANEL
ALT: 20 U/L (ref 0–44)
ANION GAP: 10 (ref 5–15)
AST: 16 U/L (ref 15–41)
Albumin: 4.4 g/dL (ref 3.5–5.0)
Alkaline Phosphatase: 68 U/L (ref 38–126)
BUN: 11 mg/dL (ref 8–23)
CO2: 27 mmol/L (ref 22–32)
Calcium: 9.4 mg/dL (ref 8.9–10.3)
Chloride: 104 mmol/L (ref 98–111)
Creatinine, Ser: 0.95 mg/dL (ref 0.44–1.00)
GFR calc Af Amer: >60 mL/min (ref >60)
GFR calc non Af Amer: >60 mL/min (ref >60)
Glucose, Bld: 105 mg/dL — ABNORMAL HIGH (ref 70–99)
Potassium: 3.9 mmol/L (ref 3.5–5.1)
Sodium: 141 mmol/L (ref 135–145)
Total Bilirubin: 0.9 mg/dL (ref 0.3–1.2)
Total Protein: 8.1 g/dL (ref 6.5–8.1)

## 2018-06-19 LAB — LIPASE, BLOOD: Lipase: 59 U/L — ABNORMAL HIGH (ref 11–51)

## 2018-06-19 MED ORDER — IOPAMIDOL (ISOVUE-300) INJECTION 61%
INTRAVENOUS | Status: AC
  Filled 2018-06-19: qty 100

## 2018-06-19 MED ORDER — SODIUM CHLORIDE 0.9 % IV BOLUS
1000.0000 mL | Freq: Once | INTRAVENOUS | Status: AC
  Administered 2018-06-19: 1000 mL via INTRAVENOUS

## 2018-06-19 MED ORDER — MORPHINE SULFATE (PF) 4 MG/ML IV SOLN
4.0000 mg | Freq: Once | INTRAVENOUS | Status: AC
  Administered 2018-06-19: 4 mg via INTRAVENOUS
  Filled 2018-06-19: qty 1

## 2018-06-19 MED ORDER — IOPAMIDOL (ISOVUE-300) INJECTION 61%
100.0000 mL | Freq: Once | INTRAVENOUS | Status: AC | PRN
  Administered 2018-06-19: 100 mL via INTRAVENOUS

## 2018-06-19 MED ORDER — SODIUM CHLORIDE (PF) 0.9 % IJ SOLN
INTRAMUSCULAR | Status: AC
  Filled 2018-06-19: qty 50

## 2018-06-19 MED ORDER — ONDANSETRON HCL 4 MG/2ML IJ SOLN
4.0000 mg | Freq: Once | INTRAMUSCULAR | Status: AC
  Administered 2018-06-19: 4 mg via INTRAVENOUS
  Filled 2018-06-19: qty 2

## 2018-06-19 NOTE — ED Provider Notes (Signed)
[facility]-EMERGENCY DEPT Provider Note   CSN: [id number] Arrival date & time: 06/19/18  1409     History   Chief Complaint Chief Complaint  Patient presents with  . Abdominal Pain    HPI Kimberly Farrell is a 63 y.o. female.  HPI 63 year old female with past medical history as below here with abdominal pain.  The patient states that for the last week, she has had persistently worsening, severe, aching, gnawing, epigastric pain.  The pain radiates towards her back.  The pain began gradually and is progressively worsened.  Pain seems to be worse after eating and with palpation.  No alleviating factors.  She saw her doctor several days ago, and had lab work which she was told showed an elevated pancreas enzymes.  She is subsequently sent here for evaluation.  Denies any fevers or chills.  No alcohol use.  Denies any new medication or regular medication use.  She does have history of gallstones but status post cholecystectomy.  She has had one bout of pancreatitis in the past. No fever or chills.  History reviewed. No pertinent past medical history.  There are no active problems to display for this patient.   Past Surgical History:  Procedure Laterality Date  . CHOLECYSTECTOMY N/A 03/10/2013   Procedure: LAPAROSCOPIC CHOLECYSTECTOMY WITH INTRAOPERATIVE CHOLANGIOGRAM;  Surgeon: [name], MD;  Location: [facility];  Service: General;  Laterality: N/A;     OB History   No obstetric history on file.      Home Medications    Prior to Admission medications   Medication Sig Start Date End Date Taking? Authorizing Provider  esomeprazole (NEXIUM) 20 MG capsule Take 20 mg by mouth daily as needed (reflux).   Yes [provider]  TRAVATAN Z 0.004 % SOLN ophthalmic solution Place 1 drop into both eyes daily. 05/18/18  Yes [provider]  amoxicillin-clavulanate (AUGMENTIN) 875-125 MG tablet Take 1 tablet by mouth every 12 (twelve) hours for 7 days.  06/20/18 06/27/18  Shaune Pollack, MD  HYDROcodone-acetaminophen (NORCO/VICODIN) 5-325 MG tablet Take 1-2 tablets by mouth every 6 (six) hours as needed for moderate pain or severe pain. 06/20/18   Shaune Pollack, MD  ondansetron (ZOFRAN ODT) 4 MG disintegrating tablet Take 1 tablet (4 mg total) by mouth every 8 (eight) hours as needed for nausea or vomiting. 06/20/18   Shaune Pollack, MD    Family History History reviewed. No pertinent family history.  Social History Social History   Tobacco Use  . Smoking status: Never Smoker  . Smokeless tobacco: Never Used  Substance Use Topics  . Alcohol use: No  . Drug use: No     Allergies   Patient has no known allergies.   Review of Systems Review of Systems  Constitutional: Positive for fatigue. Negative for chills and fever.  HENT: Negative for congestion and rhinorrhea.   Eyes: Negative for visual disturbance.  Respiratory: Negative for cough, shortness of breath and wheezing.   Cardiovascular: Negative for chest pain and leg swelling.  Gastrointestinal: Positive for abdominal pain, nausea and vomiting. Negative for diarrhea.  Genitourinary: Negative for dysuria and flank pain.  Musculoskeletal: Negative for neck pain and neck stiffness.  Skin: Negative for rash and wound.  Allergic/Immunologic: Negative for immunocompromised state.  Neurological: Negative for syncope, weakness and headaches.  All other systems reviewed and are negative.    Physical Exam Updated Vital Signs BP 137/68   Pulse 82   Temp 98 F (36.7 C) (Oral)  Resp 18   Ht 4\' 11"  (1.499 m)   Wt 57.2 kg   SpO2 100%   BMI 25.45 kg/m   Physical Exam Vitals signs and nursing note reviewed.  Constitutional:      General: She is not in acute distress.    Appearance: She is well-developed.  HENT:     Head: Normocephalic and atraumatic.  Eyes:     Conjunctiva/sclera: Conjunctivae normal.  Neck:     Musculoskeletal: Neck supple.  Cardiovascular:      Rate and Rhythm: Normal rate and regular rhythm.     Heart sounds: Normal heart sounds. No murmur. No friction rub.  Pulmonary:     Effort: Pulmonary effort is normal. No respiratory distress.     Breath sounds: Normal breath sounds. No wheezing or rales.  Abdominal:     General: There is no distension.     Palpations: Abdomen is soft.     Tenderness: There is abdominal tenderness in the epigastric area. There is no guarding or rebound.  Skin:    General: Skin is warm.     Capillary Refill: Capillary refill takes less than 2 seconds.  Neurological:     Mental Status: She is alert and oriented to person, place, and time.     Motor: No abnormal muscle tone.      ED Treatments / Results  Labs (all labs ordered are listed, but only abnormal results are displayed) Labs Reviewed  LIPASE, BLOOD - Abnormal; Notable for the following components:      Result Value   Lipase 59 (*)    All other components within normal limits  COMPREHENSIVE METABOLIC PANEL - Abnormal; Notable for the following components:   Glucose, Bld 105 (*)    All other components within normal limits  CBC    EKG None  Radiology Ct Abdomen Pelvis W Contrast  Result Date: 06/20/2018 CLINICAL DATA:  Acute abdominal pain. Patient says she has pancreatitis. EXAM: CT ABDOMEN AND PELVIS WITH CONTRAST TECHNIQUE: Multidetector CT imaging of the abdomen and pelvis was performed using the standard protocol following bolus administration of intravenous contrast. CONTRAST:  ISOVUE-300 IOPAMIDOL (ISOVUE-300) INJECTION 61% COMPARISON:  10/17/2005 FINDINGS: Lower chest: Atelectasis in the lung bases. Hepatobiliary: No focal liver abnormality is seen. Status post cholecystectomy. No biliary dilatation. Pancreas: Unremarkable. No pancreatic ductal dilatation or surrounding inflammatory changes. Spleen: Normal in size without focal abnormality. Adrenals/Urinary Tract: No adrenal gland nodules. Kidneys are homogeneous and  symmetrical. Prominent extrarenal pelvises bilaterally. No obstructing stones or ureterectasis demonstrated. Bladder is mostly decompressed but appears unremarkable. Stomach/Bowel: Stomach is decompressed. Small bowel are mostly decompressed. There is a single mildly dilated fluid-filled small bowel loop in the anterior pelvis. The distal terminal ileum is decompressed. Decompression limits evaluation of the wall but the wall appears to be somewhat hyperemic. This could represent inflammatory stricture. Consider Crohn disease. Scattered stool throughout the colon. No colonic inflammatory changes identified. Scattered colonic diverticula without diverticulitis. Appendix is normal. Vascular/Lymphatic: No significant vascular findings are present. No enlarged abdominal or pelvic lymph nodes. Reproductive: Uterus and bilateral adnexa are unremarkable. Other: No abdominal wall hernia or abnormality. No abdominopelvic ascites. Musculoskeletal: No acute or significant osseous findings. IMPRESSION: There is a single mildly dilated fluid-filled small bowel loop in the anterior pelvis. The distal terminal ileum is decompressed with suggestion of mural hyperemia. This could just be due to bowel compression, but it may represent inflammatory stricture. Consider Crohn disease or focal enteritis. No evidence of significant bowel obstruction.  Electronically Signed   By: Burman NievesWilliam  Stevens M.D.   On: 06/20/2018 00:06    Procedures Procedures (including critical care time)  Medications Ordered in ED Medications  morphine 4 MG/ML injection 4 mg (4 mg Intravenous Given 06/19/18 2330)  ondansetron (ZOFRAN) injection 4 mg (4 mg Intravenous Given 06/19/18 2329)  sodium chloride 0.9 % bolus 1,000 mL (0 mLs Intravenous Stopped 06/20/18 0050)  iopamidol (ISOVUE-300) 61 % injection 100 mL (100 mLs Intravenous Contrast Given 06/19/18 2342)  oxyCODONE-acetaminophen (PERCOCET/ROXICET) 5-325 MG per tablet 1 tablet (1 tablet Oral Given  06/20/18 0048)  dicyclomine (BENTYL) capsule 20 mg (20 mg Oral Given 06/20/18 0048)     Initial Impression / Assessment and Plan / ED Course  I have reviewed the triage vital signs and the nursing notes.  Pertinent labs & imaging results that were available during my care of the patient were reviewed by me and considered in my medical decision making (see chart for details).     63 yo F here with abdominal pain, nausea. Lipase elevated and history is consistent with likely uncomplicated pancreatitis. No fever or signs of hemorrhagic or necrotic pancreatitis. Denies any alcohol use, and she is s/p cholecystectomy. Denies any drug or supplement use. Will check CT given recurrent pancreatitis w/o clear onset, give pain control. If pain controlled, can likely discharge.  CT scan is c/w possible focal enteritis. DDx includes Crohn's though no history of this, no blood in stool, normal WBC, making this somewhat more unlikely. Pt feels markedly improved w/ analgesia, fluids, and is tolerating PO. Will tx with ABX for possible bacterial component, analgesics,slow diet advancement, and refer to GI as an outpatient for further eval, work-up as indicated.  Final Clinical Impressions(s) / ED Diagnoses   Final diagnoses:  Enteritis    ED Discharge Orders         Ordered    amoxicillin-clavulanate (AUGMENTIN) 875-125 MG tablet  Every 12 hours     06/20/18 0021    ondansetron (ZOFRAN ODT) 4 MG disintegrating tablet  Every 8 hours PRN     06/20/18 0021    HYDROcodone-acetaminophen (NORCO/VICODIN) 5-325 MG tablet  Every 6 hours PRN     06/20/18 [id number]           Miraya Cudney, MD 06/20/18 [phone]

## 2018-06-19 NOTE — ED Triage Notes (Signed)
Pt presents with c/o abdominal pain. Pt reports she has pancreatitis and was sent her today because her enzymes were elevated and she needs a CT scan. Pt reports she has had abdominal pain for almost a week.

## 2018-06-19 NOTE — ED Notes (Signed)
Patient transported to CT, medicated for pain prior to transport VSS

## 2018-06-20 MED ORDER — OXYCODONE-ACETAMINOPHEN 5-325 MG PO TABS
1.0000 | ORAL_TABLET | Freq: Once | ORAL | Status: AC
  Administered 2018-06-20: 1 via ORAL
  Filled 2018-06-20: qty 1

## 2018-06-20 MED ORDER — ONDANSETRON 4 MG PO TBDP: 4.0000 mg | ORAL_TABLET | Freq: Three times a day (TID) | ORAL | 0 refills | Status: DC | PRN

## 2018-06-20 MED ORDER — AMOXICILLIN-POT CLAVULANATE 875-125 MG PO TABS: 1.0000 | ORAL_TABLET | Freq: Two times a day (BID) | ORAL | 0 refills | Status: AC

## 2018-06-20 MED ORDER — HYDROCODONE-ACETAMINOPHEN 5-325 MG PO TABS: 1.0000 | ORAL_TABLET | Freq: Four times a day (QID) | ORAL | 0 refills | Status: DC | PRN

## 2018-06-20 MED ORDER — DICYCLOMINE HCL 10 MG PO CAPS
20.0000 mg | ORAL_CAPSULE | Freq: Once | ORAL | Status: AC
  Administered 2018-06-20: 20 mg via ORAL
  Filled 2018-06-20: qty 2

## 2018-07-03 ENCOUNTER — Other Ambulatory Visit: Payer: Self-pay | Admitting: Gastroenterology

## 2018-07-03 DIAGNOSIS — R1084 Generalized abdominal pain: Secondary | ICD-10-CM | POA: Diagnosis not present

## 2018-07-03 DIAGNOSIS — R9389 Abnormal findings on diagnostic imaging of other specified body structures: Secondary | ICD-10-CM

## 2018-07-11 DIAGNOSIS — H40033 Anatomical narrow angle, bilateral: Secondary | ICD-10-CM | POA: Diagnosis not present

## 2018-07-17 DIAGNOSIS — R1084 Generalized abdominal pain: Secondary | ICD-10-CM | POA: Diagnosis not present

## 2018-08-08 ENCOUNTER — Ambulatory Visit
Admission: RE | Admit: 2018-08-08 | Discharge: 2018-08-08 | Disposition: A | Discharge status: Home / Self Care | Payer: BLUE CROSS/BLUE SHIELD | Source: Ambulatory Visit | Attending: Gastroenterology | Admitting: Gastroenterology

## 2018-08-08 DIAGNOSIS — K6389 Other specified diseases of intestine: Secondary | ICD-10-CM | POA: Diagnosis not present

## 2018-08-08 DIAGNOSIS — R9389 Abnormal findings on diagnostic imaging of other specified body structures: Secondary | ICD-10-CM

## 2018-08-08 MED ORDER — IOPAMIDOL (ISOVUE-300) INJECTION 61%
100.0000 mL | Freq: Once | INTRAVENOUS | Status: AC | PRN
  Administered 2018-08-08: 100 mL via INTRAVENOUS

## 2018-08-15 ENCOUNTER — Other Ambulatory Visit: Payer: Self-pay | Admitting: Family Medicine

## 2018-08-15 DIAGNOSIS — Z1231 Encounter for screening mammogram for malignant neoplasm of breast: Secondary | ICD-10-CM

## 2018-08-29 DIAGNOSIS — A048 Other specified bacterial intestinal infections: Secondary | ICD-10-CM | POA: Diagnosis not present

## 2018-09-10 ENCOUNTER — Ambulatory Visit: Payer: BLUE CROSS/BLUE SHIELD

## 2018-09-10 DIAGNOSIS — F439 Reaction to severe stress, unspecified: Secondary | ICD-10-CM | POA: Diagnosis not present

## 2018-09-10 DIAGNOSIS — F411 Generalized anxiety disorder: Secondary | ICD-10-CM | POA: Diagnosis not present

## 2018-09-10 DIAGNOSIS — Z13220 Encounter for screening for lipoid disorders: Secondary | ICD-10-CM | POA: Diagnosis not present

## 2018-09-10 DIAGNOSIS — Z0000 Encounter for general adult medical examination without abnormal findings: Secondary | ICD-10-CM | POA: Diagnosis not present

## 2018-09-10 DIAGNOSIS — F5101 Primary insomnia: Secondary | ICD-10-CM | POA: Diagnosis not present

## 2018-11-10 ENCOUNTER — Other Ambulatory Visit: Payer: Self-pay | Admitting: Family Medicine

## 2018-11-10 DIAGNOSIS — Z20822 Contact with and (suspected) exposure to covid-19: Secondary | ICD-10-CM

## 2018-11-12 LAB — NOVEL CORONAVIRUS, NAA: SARS-CoV-2, NAA: NOT DETECTED

## 2018-11-19 ENCOUNTER — Other Ambulatory Visit: Payer: Self-pay

## 2018-11-19 ENCOUNTER — Ambulatory Visit
Admission: RE | Admit: 2018-11-19 | Discharge: 2018-11-19 | Disposition: A | Discharge status: Home / Self Care | Payer: BC Managed Care – PPO | Source: Ambulatory Visit | Attending: Family Medicine | Admitting: Family Medicine

## 2018-11-19 DIAGNOSIS — Z1231 Encounter for screening mammogram for malignant neoplasm of breast: Secondary | ICD-10-CM

## 2018-11-29 DIAGNOSIS — R109 Unspecified abdominal pain: Secondary | ICD-10-CM | POA: Diagnosis not present

## 2018-12-15 ENCOUNTER — Other Ambulatory Visit: Payer: Self-pay

## 2018-12-15 DIAGNOSIS — Z20822 Contact with and (suspected) exposure to covid-19: Secondary | ICD-10-CM

## 2018-12-21 LAB — NOVEL CORONAVIRUS, NAA: SARS-CoV-2, NAA: NOT DETECTED

## 2019-01-08 DIAGNOSIS — H401132 Primary open-angle glaucoma, bilateral, moderate stage: Secondary | ICD-10-CM | POA: Diagnosis not present

## 2019-01-22 DIAGNOSIS — H401132 Primary open-angle glaucoma, bilateral, moderate stage: Secondary | ICD-10-CM | POA: Diagnosis not present

## 2019-01-23 DIAGNOSIS — M5416 Radiculopathy, lumbar region: Secondary | ICD-10-CM | POA: Diagnosis not present

## 2019-01-23 DIAGNOSIS — M545 Low back pain: Secondary | ICD-10-CM | POA: Diagnosis not present

## 2019-02-22 DIAGNOSIS — F419 Anxiety disorder, unspecified: Secondary | ICD-10-CM | POA: Diagnosis not present

## 2019-02-22 DIAGNOSIS — F321 Major depressive disorder, single episode, moderate: Secondary | ICD-10-CM | POA: Diagnosis not present

## 2019-03-28 DIAGNOSIS — F439 Reaction to severe stress, unspecified: Secondary | ICD-10-CM | POA: Diagnosis not present

## 2019-03-28 DIAGNOSIS — F321 Major depressive disorder, single episode, moderate: Secondary | ICD-10-CM | POA: Diagnosis not present

## 2019-03-28 DIAGNOSIS — F411 Generalized anxiety disorder: Secondary | ICD-10-CM | POA: Diagnosis not present

## 2019-04-13 ENCOUNTER — Other Ambulatory Visit: Payer: Self-pay

## 2019-04-13 DIAGNOSIS — Z20822 Contact with and (suspected) exposure to covid-19: Secondary | ICD-10-CM

## 2019-04-15 LAB — NOVEL CORONAVIRUS, NAA: SARS-CoV-2, NAA: NOT DETECTED

## 2019-04-16 ENCOUNTER — Telehealth: Payer: Self-pay | Admitting: General Practice

## 2019-04-16 NOTE — Telephone Encounter (Signed)
Negative COVID results given. Patient results "NOT Detected." Caller expressed understanding. ° °

## 2019-04-30 ENCOUNTER — Other Ambulatory Visit: Payer: Self-pay

## 2019-04-30 DIAGNOSIS — Z20822 Contact with and (suspected) exposure to covid-19: Secondary | ICD-10-CM

## 2019-05-02 LAB — NOVEL CORONAVIRUS, NAA: SARS-CoV-2, NAA: NOT DETECTED

## 2019-05-21 DIAGNOSIS — M79644 Pain in right finger(s): Secondary | ICD-10-CM | POA: Diagnosis not present

## 2019-05-28 DIAGNOSIS — H401132 Primary open-angle glaucoma, bilateral, moderate stage: Secondary | ICD-10-CM | POA: Diagnosis not present

## 2019-06-28 DIAGNOSIS — M79641 Pain in right hand: Secondary | ICD-10-CM | POA: Diagnosis not present

## 2019-08-05 ENCOUNTER — Ambulatory Visit: Payer: Medicare Other | Attending: Internal Medicine

## 2019-08-05 DIAGNOSIS — Z23 Encounter for immunization: Secondary | ICD-10-CM

## 2019-08-05 NOTE — Progress Notes (Signed)
   Covid-19 Vaccination Clinic  Name:  DEMARIA DEENEY    MRN: [id number] DOB: 08-14-1955  08/05/2019  Ms. Hanser was observed post Covid-19 immunization for 15 minutes without incidence. She was provided with Vaccine Information Sheet and instruction to access the V-Safe system.   Ms. Calkin was instructed to call 911 with any severe reactions post vaccine: Marland Kitchen Difficulty breathing  . Swelling of your face and throat  . A fast heartbeat  . A bad rash all over your body  . Dizziness and weakness    Immunizations Administered    Name Date Dose VIS Date Route   Pfizer COVID-19 Vaccine 08/05/2019  8:55 AM 0.3 mL 05/17/2019 Intramuscular   Manufacturer: [employer], [employer]   Lot: [id number]   NDC: [address]-1

## 2019-08-14 DIAGNOSIS — Z79891 Long term (current) use of opiate analgesic: Secondary | ICD-10-CM | POA: Diagnosis not present

## 2019-08-14 DIAGNOSIS — M5136 Other intervertebral disc degeneration, lumbar region: Secondary | ICD-10-CM | POA: Diagnosis not present

## 2019-09-03 ENCOUNTER — Ambulatory Visit: Payer: Medicare Other | Attending: Internal Medicine

## 2019-09-03 DIAGNOSIS — Z23 Encounter for immunization: Secondary | ICD-10-CM

## 2019-09-03 NOTE — Progress Notes (Signed)
   Covid-19 Vaccination Clinic  Name:  Kimberly Farrell    MRN: [id number] DOB: 1956/03/25  09/03/2019  Kimberly Farrell was observed post Covid-19 immunization for 15 minutes without incident. She was provided with Vaccine Information Sheet and instruction to access the V-Safe system.   Kimberly Farrell was instructed to call 911 with any severe reactions post vaccine: Marland Kitchen Difficulty breathing  . Swelling of face and throat  . A fast heartbeat  . A bad rash all over body  . Dizziness and weakness   Immunizations Administered    Name Date Dose VIS Date Route   Pfizer COVID-19 Vaccine 09/03/2019  9:17 AM 0.3 mL 05/17/2019 Intramuscular   Manufacturer: [employer], [employer]   Lot: [id number]   NDC: [address]-4

## 2019-09-12 DIAGNOSIS — M8588 Other specified disorders of bone density and structure, other site: Secondary | ICD-10-CM | POA: Diagnosis not present

## 2019-09-12 DIAGNOSIS — Z0000 Encounter for general adult medical examination without abnormal findings: Secondary | ICD-10-CM | POA: Diagnosis not present

## 2019-09-12 DIAGNOSIS — Z800 Family history of malignant neoplasm of digestive organs: Secondary | ICD-10-CM | POA: Diagnosis not present

## 2019-09-12 DIAGNOSIS — E559 Vitamin D deficiency, unspecified: Secondary | ICD-10-CM | POA: Diagnosis not present

## 2019-09-13 ENCOUNTER — Other Ambulatory Visit: Payer: Self-pay | Admitting: Family Medicine

## 2019-09-13 DIAGNOSIS — M8580 Other specified disorders of bone density and structure, unspecified site: Secondary | ICD-10-CM

## 2019-09-13 DIAGNOSIS — Z1231 Encounter for screening mammogram for malignant neoplasm of breast: Secondary | ICD-10-CM

## 2019-09-26 DIAGNOSIS — H401131 Primary open-angle glaucoma, bilateral, mild stage: Secondary | ICD-10-CM | POA: Diagnosis not present

## 2019-10-11 DIAGNOSIS — Z23 Encounter for immunization: Secondary | ICD-10-CM | POA: Diagnosis not present

## 2019-11-29 ENCOUNTER — Ambulatory Visit
Admission: RE | Admit: 2019-11-29 | Discharge: 2019-11-29 | Disposition: A | Discharge status: Home / Self Care | Payer: Medicare Other | Source: Ambulatory Visit | Attending: Family Medicine | Admitting: Family Medicine

## 2019-11-29 ENCOUNTER — Other Ambulatory Visit: Payer: Self-pay

## 2019-11-29 DIAGNOSIS — M8589 Other specified disorders of bone density and structure, multiple sites: Secondary | ICD-10-CM | POA: Diagnosis not present

## 2019-11-29 DIAGNOSIS — Z780 Asymptomatic menopausal state: Secondary | ICD-10-CM | POA: Diagnosis not present

## 2019-11-29 DIAGNOSIS — Z1231 Encounter for screening mammogram for malignant neoplasm of breast: Secondary | ICD-10-CM

## 2019-11-29 DIAGNOSIS — M8580 Other specified disorders of bone density and structure, unspecified site: Secondary | ICD-10-CM

## 2020-01-27 DIAGNOSIS — H35363 Drusen (degenerative) of macula, bilateral: Secondary | ICD-10-CM | POA: Diagnosis not present

## 2020-01-27 DIAGNOSIS — H40013 Open angle with borderline findings, low risk, bilateral: Secondary | ICD-10-CM | POA: Diagnosis not present

## 2020-02-27 DIAGNOSIS — M25552 Pain in left hip: Secondary | ICD-10-CM | POA: Diagnosis not present

## 2020-03-06 DIAGNOSIS — M25552 Pain in left hip: Secondary | ICD-10-CM | POA: Diagnosis not present

## 2020-03-16 DIAGNOSIS — Z23 Encounter for immunization: Secondary | ICD-10-CM | POA: Diagnosis not present

## 2020-03-16 DIAGNOSIS — F411 Generalized anxiety disorder: Secondary | ICD-10-CM | POA: Diagnosis not present

## 2020-04-04 ENCOUNTER — Ambulatory Visit: Payer: Medicare Other | Attending: Internal Medicine

## 2020-04-04 DIAGNOSIS — Z23 Encounter for immunization: Secondary | ICD-10-CM

## 2020-04-04 NOTE — Progress Notes (Signed)
   Covid-19 Vaccination Clinic  Name:  Kimberly Farrell    MRN: [id number] DOB: May 30, 1956  04/04/2020  Kimberly Farrell was observed post Covid-19 immunization for 15 minutes without incident. She was provided with Vaccine Information Sheet and instruction to access the V-Safe system.   Kimberly Farrell was instructed to call 911 with any severe reactions post vaccine: Marland Kitchen Difficulty breathing  . Swelling of face and throat  . A fast heartbeat  . A bad rash all over body  . Dizziness and weakness

## 2020-04-29 DIAGNOSIS — M25552 Pain in left hip: Secondary | ICD-10-CM | POA: Diagnosis not present

## 2020-05-06 DIAGNOSIS — M1612 Unilateral primary osteoarthritis, left hip: Secondary | ICD-10-CM | POA: Diagnosis not present

## 2020-05-20 DIAGNOSIS — Z20822 Contact with and (suspected) exposure to covid-19: Secondary | ICD-10-CM | POA: Diagnosis not present

## 2020-06-01 DIAGNOSIS — H401131 Primary open-angle glaucoma, bilateral, mild stage: Secondary | ICD-10-CM | POA: Diagnosis not present

## 2020-06-17 DIAGNOSIS — Z1152 Encounter for screening for COVID-19: Secondary | ICD-10-CM | POA: Diagnosis not present

## 2020-08-17 ENCOUNTER — Encounter: Payer: Self-pay | Admitting: Emergency Medicine

## 2020-08-17 ENCOUNTER — Other Ambulatory Visit: Payer: Self-pay

## 2020-08-17 ENCOUNTER — Ambulatory Visit (INDEPENDENT_AMBULATORY_CARE_PROVIDER_SITE_OTHER): Payer: Medicare Other

## 2020-08-17 ENCOUNTER — Ambulatory Visit
Admission: EM | Admit: 2020-08-17 | Discharge: 2020-08-17 | Disposition: A | Discharge status: Home / Self Care | Payer: Medicare Other

## 2020-08-17 DIAGNOSIS — S8001XA Contusion of right knee, initial encounter: Secondary | ICD-10-CM | POA: Diagnosis not present

## 2020-08-17 DIAGNOSIS — Z01812 Encounter for preprocedural laboratory examination: Secondary | ICD-10-CM | POA: Diagnosis not present

## 2020-08-17 DIAGNOSIS — W19XXXA Unspecified fall, initial encounter: Secondary | ICD-10-CM | POA: Diagnosis not present

## 2020-08-17 DIAGNOSIS — S8002XA Contusion of left knee, initial encounter: Secondary | ICD-10-CM

## 2020-08-17 DIAGNOSIS — M25562 Pain in left knee: Secondary | ICD-10-CM

## 2020-08-17 MED ORDER — IBUPROFEN 800 MG PO TABS: 800.0000 mg | ORAL_TABLET | Freq: Three times a day (TID) | ORAL | 0 refills | Status: DC

## 2020-08-17 NOTE — ED Provider Notes (Signed)
[facility]    CSN: [id number] Arrival date & time: 08/17/20  3267      History   Chief Complaint Chief Complaint  Patient presents with  . Fall    HPI Kimberly Farrell is a 65 y.o. female presenting today for evaluation of knee pain.  Reports tripped and fell on a rug yesterday.  Tripped while walking.  Knees have been sore since, left slightly worse than right.  Using ice and Tylenol.  Denies history of easy fracture/osteoporosis.  HPI  History reviewed. No pertinent past medical history.  There are no problems to display for this patient.   Past Surgical History:  Procedure Laterality Date  . CHOLECYSTECTOMY N/A 03/10/2013   Procedure: LAPAROSCOPIC CHOLECYSTECTOMY WITH INTRAOPERATIVE CHOLANGIOGRAM;  Surgeon: [name], MD;  Location: [facility];  Service: General;  Laterality: N/A;    OB History   No obstetric history on file.      Home Medications    Prior to Admission medications   Medication Sig Start Date End Date Taking? Authorizing Provider  acetaminophen (TYLENOL) 325 MG tablet Take 650 mg by mouth every 6 (six) hours as needed.   Yes [provider]  ibuprofen (ADVIL) 800 MG tablet Take 1 tablet (800 mg total) by mouth 3 (three) times daily. 08/17/20  Yes Saheed Carrington C, PA-C  TRAVATAN Z 0.004 % SOLN ophthalmic solution Place 1 drop into both eyes daily. 05/18/18  Yes [provider]  esomeprazole (NEXIUM) 20 MG capsule Take 20 mg by mouth daily as needed (reflux).  08/17/20  [provider]    Family History History reviewed. No pertinent family history.  Social History Social History   Tobacco Use  . Smoking status: Never Smoker  . Smokeless tobacco: Never Used  Vaping Use  . Vaping Use: Never used  Substance Use Topics  . Alcohol use: No  . Drug use: No     Allergies   Patient has no known allergies.   Review of Systems Review of Systems  Constitutional: Negative for fatigue and fever.  Eyes:  Negative for visual disturbance.  Respiratory: Negative for shortness of breath.   Cardiovascular: Negative for chest pain.  Gastrointestinal: Negative for abdominal pain, nausea and vomiting.  Musculoskeletal: Positive for arthralgias and joint swelling.  Skin: Negative for color change, rash and wound.  Neurological: Negative for dizziness, weakness, light-headedness and headaches.     Physical Exam Triage Vital Signs ED Triage Vitals  Enc Vitals Group     BP 08/17/20 0915 128/82     Pulse Rate 08/17/20 0915 60     Resp 08/17/20 0915 20     Temp 08/17/20 0915 98.2 F (36.8 C)     Temp Source 08/17/20 0915 Oral     SpO2 08/17/20 0915 98 %     Weight --      Height --      Head Circumference --      Peak Flow --      Pain Score 08/17/20 0934 7     Pain Loc --      Pain Edu? --      Excl. in GC? --    No data found.  Updated Vital Signs BP 128/82 (BP Location: Left Arm)   Pulse 60   Temp 98.2 F (36.8 C) (Oral)   Resp 20   SpO2 98%   Visual Acuity Right Eye Distance:   Left Eye Distance:   Bilateral Distance:    Right Eye  Near:   Left Eye Near:    Bilateral Near:     Physical Exam Vitals and nursing note reviewed.  Constitutional:      Appearance: She is well-developed.     Comments: No acute distress  HENT:     Head: Normocephalic and atraumatic.     Nose: Nose normal.  Eyes:     Conjunctiva/sclera: Conjunctivae normal.  Cardiovascular:     Rate and Rhythm: Normal rate.  Pulmonary:     Effort: Pulmonary effort is normal. No respiratory distress.  Abdominal:     General: There is no distension.  Musculoskeletal:        General: Normal range of motion.     Cervical back: Neck supple.     Comments: Right knee: No obvious swelling or deformity, nontender to palpation over patella, mild tenderness to medial lateral joint line, full active range of motion without any crepitus palpated  Left knee: Tender to palpation over patella, palpation triggers  quadricep spasming, tender to palpation to medial lateral joint lines extending into the popliteal area, full active range of motion, no laxity appreciated with special testing  Skin:    General: Skin is warm and dry.  Neurological:     Mental Status: She is alert and oriented to person, place, and time.      UC Treatments / Results  Labs (all labs ordered are listed, but only abnormal results are displayed) Labs Reviewed - No data to display  EKG   Radiology DG Knee Complete 4 Views Left  Result Date: 08/17/2020 CLINICAL DATA:  Fall yesterday.  Knee pain EXAM: LEFT KNEE - COMPLETE 4+ VIEW COMPARISON:  None. FINDINGS: No evidence of fracture, dislocation, or joint effusion. No evidence of arthropathy or other focal bone abnormality. Soft tissues are unremarkable. IMPRESSION: Negative. Electronically Signed   By: Marlan Palau M.D.   On: 08/17/2020 10:21    Procedures Procedures (including critical care time)  Medications Ordered in UC Medications - No data to display  Initial Impression / Assessment and Plan / UC Course  I have reviewed the triage vital signs and the nursing notes.  Pertinent labs & imaging results that were available during my care of the patient were reviewed by me and considered in my medical decision making (see chart for details).     X-ray negative for fracture, suspect likely contusion bilaterally, recommending conservative treatment with anti-inflammatories rest ice and elevation.  Discussed strict return precautions. Patient verbalized understanding and is agreeable with plan.  Final Clinical Impressions(s) / UC Diagnoses   Final diagnoses:  Contusion of left knee, initial encounter  Contusion of right knee, initial encounter     Discharge Instructions     X-ray negative Tylenol and ibuprofen for pain and swelling Ice and elevate knees May use Ace wrap for compression Follow-up if not improving or worsening    ED Prescriptions     Medication Sig Dispense Auth. Provider   ibuprofen (ADVIL) 800 MG tablet Take 1 tablet (800 mg total) by mouth 3 (three) times daily. 21 tablet Xolani Degracia, [place] C, PA-C     PDMP not reviewed this encounter.   Fleet Higham, [place] C, PA-C 08/17/20 1027

## 2020-08-17 NOTE — ED Triage Notes (Signed)
Patient states she tripped on rug yesterday.  Patient landed on knees. Both knees are sore, left worse than right knee.  Patient used ice on knee last night and took tylenol

## 2020-08-17 NOTE — Discharge Instructions (Signed)
X-ray negative Tylenol and ibuprofen for pain and swelling Ice and elevate knees May use Ace wrap for compression Follow-up if not improving or worsening

## 2020-08-20 DIAGNOSIS — Z1211 Encounter for screening for malignant neoplasm of colon: Secondary | ICD-10-CM | POA: Diagnosis not present

## 2020-08-20 DIAGNOSIS — K621 Rectal polyp: Secondary | ICD-10-CM | POA: Diagnosis not present

## 2020-08-20 DIAGNOSIS — Z800 Family history of malignant neoplasm of digestive organs: Secondary | ICD-10-CM | POA: Diagnosis not present

## 2020-08-20 DIAGNOSIS — K5730 Diverticulosis of large intestine without perforation or abscess without bleeding: Secondary | ICD-10-CM | POA: Diagnosis not present

## 2020-08-20 DIAGNOSIS — K648 Other hemorrhoids: Secondary | ICD-10-CM | POA: Diagnosis not present

## 2020-08-20 DIAGNOSIS — D123 Benign neoplasm of transverse colon: Secondary | ICD-10-CM | POA: Diagnosis not present

## 2020-08-25 DIAGNOSIS — D123 Benign neoplasm of transverse colon: Secondary | ICD-10-CM | POA: Diagnosis not present

## 2020-08-25 DIAGNOSIS — K621 Rectal polyp: Secondary | ICD-10-CM | POA: Diagnosis not present

## 2020-09-20 IMAGING — CT CT ABD-PELV W/ CM
2 of 5 series · 16 of 46 positions shown, 18 images · IV contrast (ISOVUE)
Comparison: 10/17/2005

CLINICAL DATA: Acute abdominal pain. Patient says she has
pancreatitis.

EXAM:
CT ABDOMEN AND PELVIS WITH CONTRAST
TECHNIQUE: Multidetector CT imaging of the abdomen and pelvis was performed
using the standard protocol following bolus administration of
intravenous contrast.
CONTRAST:  100mL YZXHAK-GUU IOPAMIDOL (YZXHAK-GUU) INJECTION 61%

[Series 2: axial st · axial · 0.59mm/px · z∈[+1246,+1556]mm · 13 of 72 slices shown, 15 images]
[im 5/72  soft-tissue]
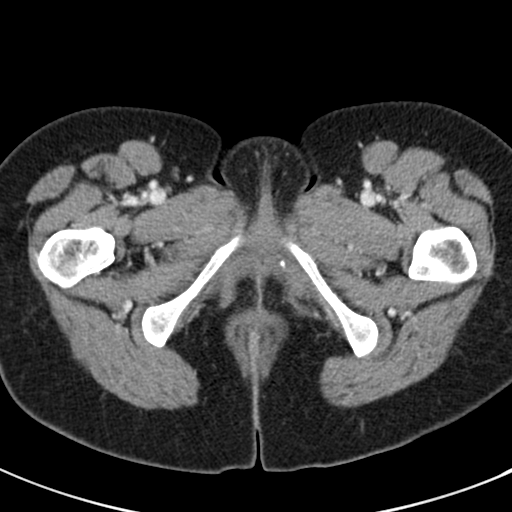
[im 5/72  bone]
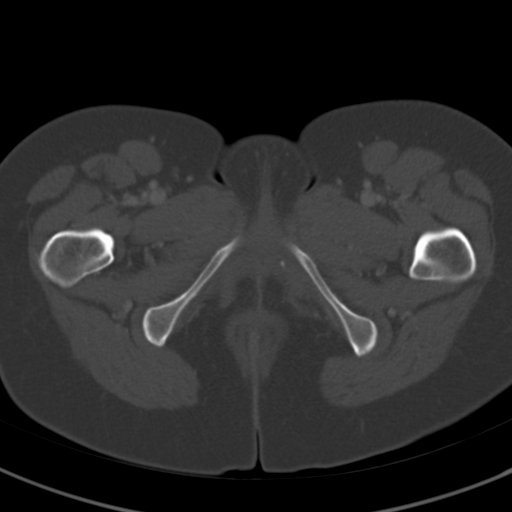
[im 9/72  soft-tissue]
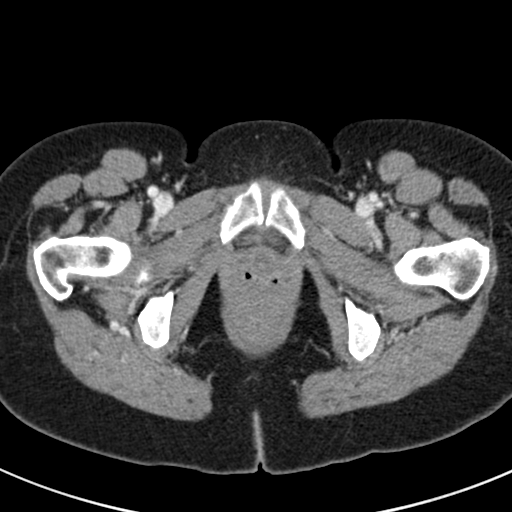
[im 17/72  soft-tissue]
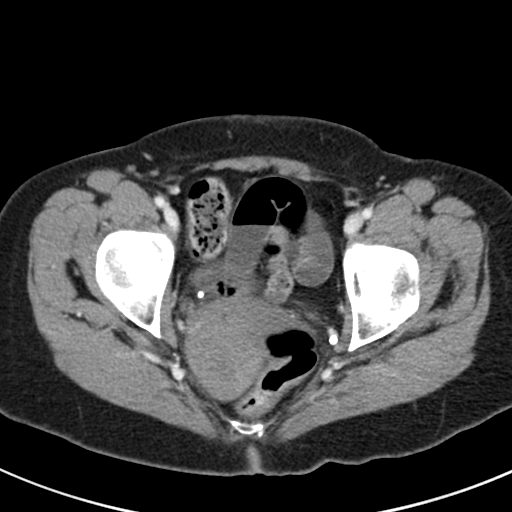
[im 21/72  soft-tissue]
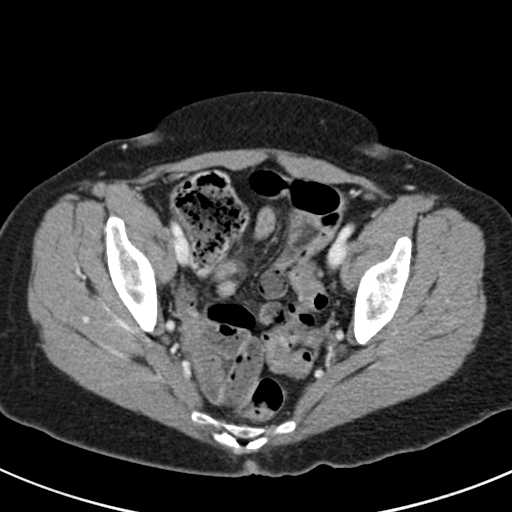
[im 26/72  soft-tissue]
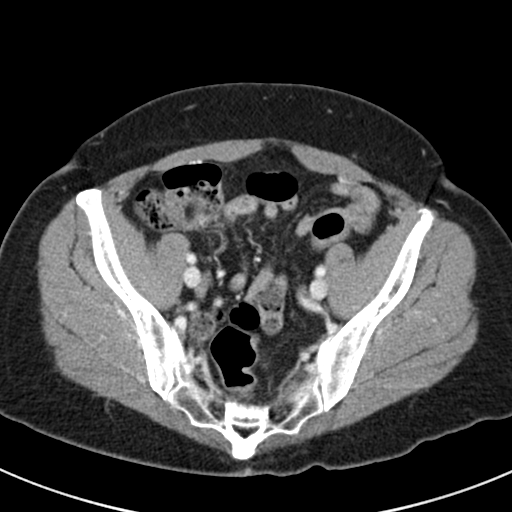
[im 30/72  soft-tissue]
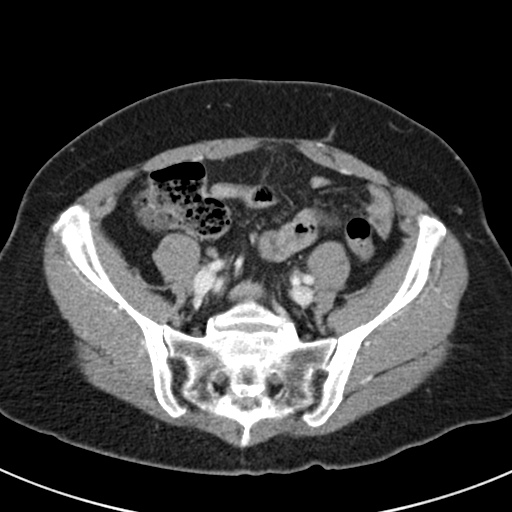
[im 38/72  soft-tissue]
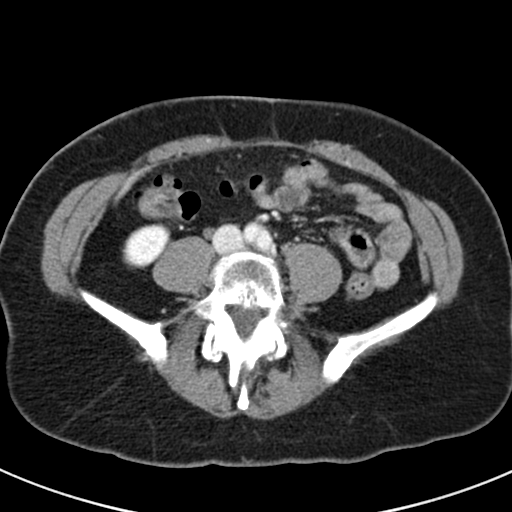
[im 42/72  soft-tissue]
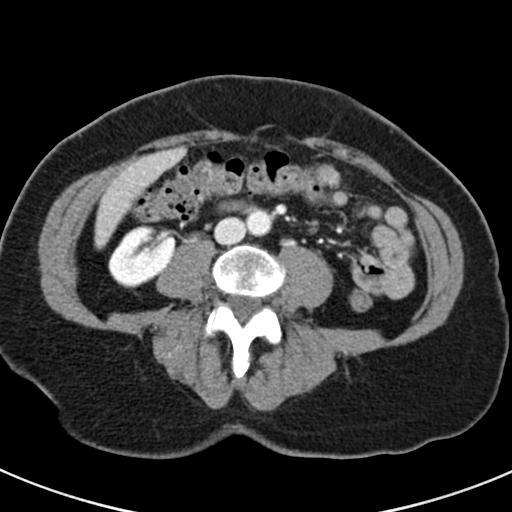
[im 46/72  soft-tissue]
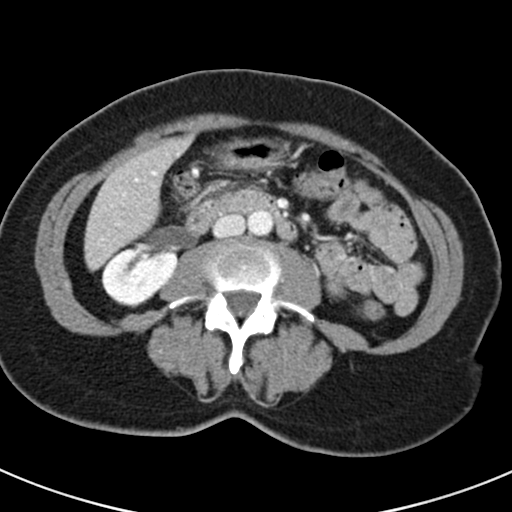
[im 46/72  bone]
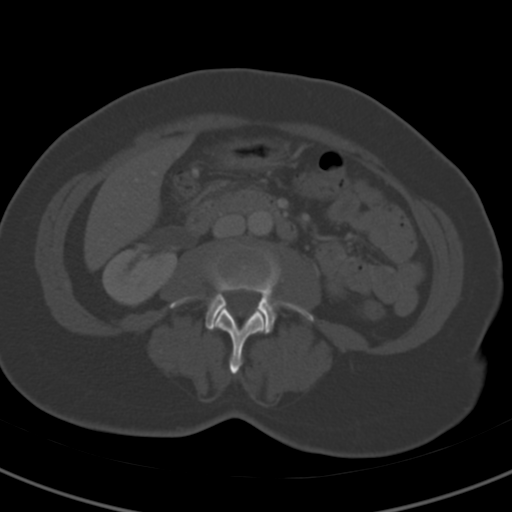
[im 51/72  soft-tissue]
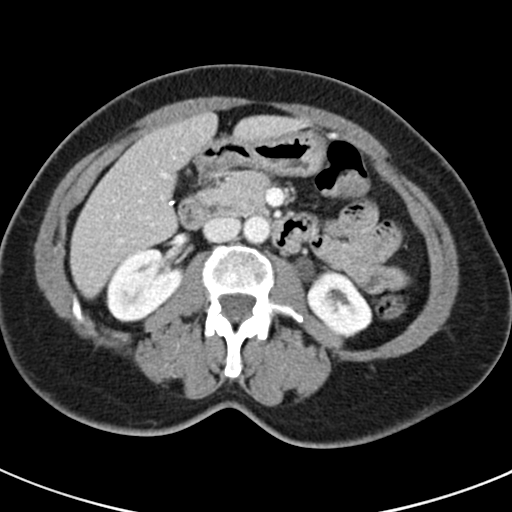
[im 55/72  soft-tissue]
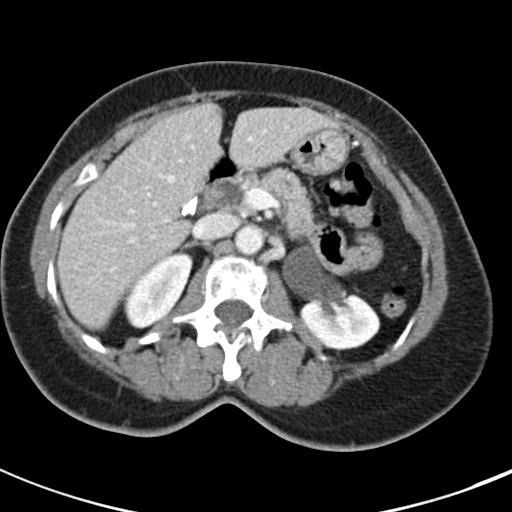
[im 63/72  soft-tissue]
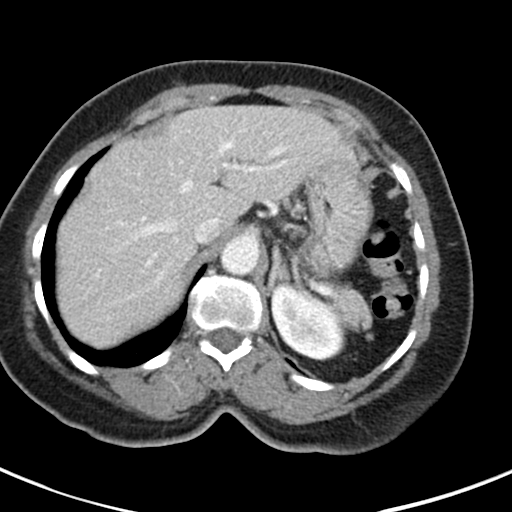
[im 67/72  soft-tissue]
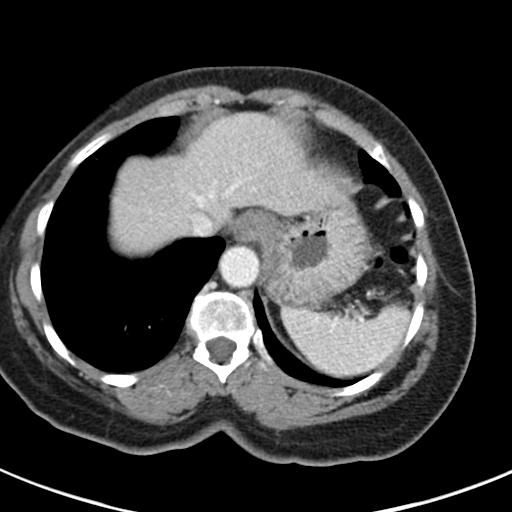

[Series 5: coronal st · coronal · 0.60mm/px · 3 of 79 slices shown]
[im 27/79  soft-tissue]
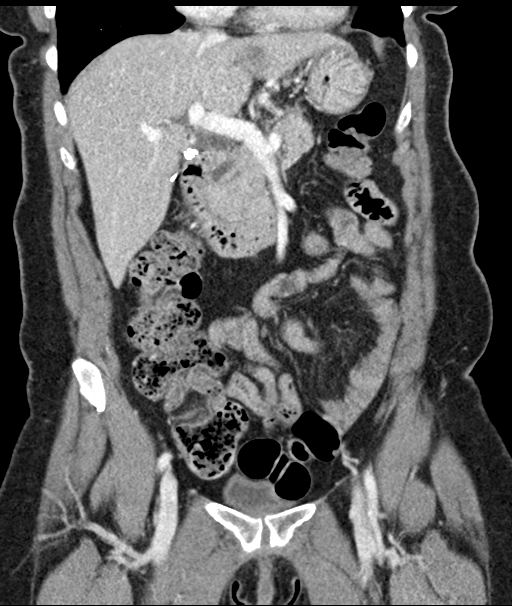
[im 35/79  soft-tissue]
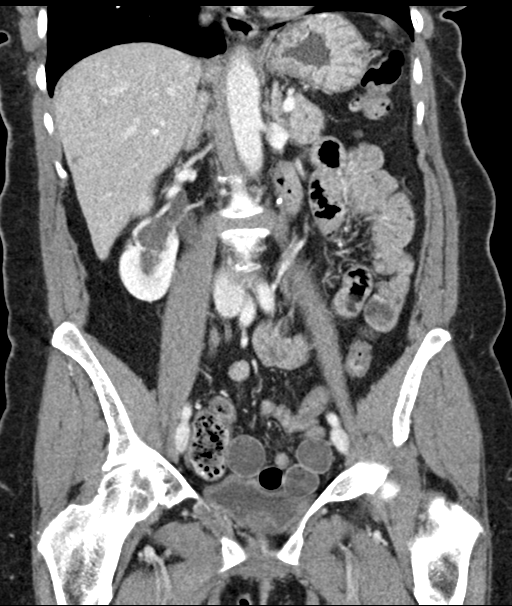
[im 44/79  soft-tissue]
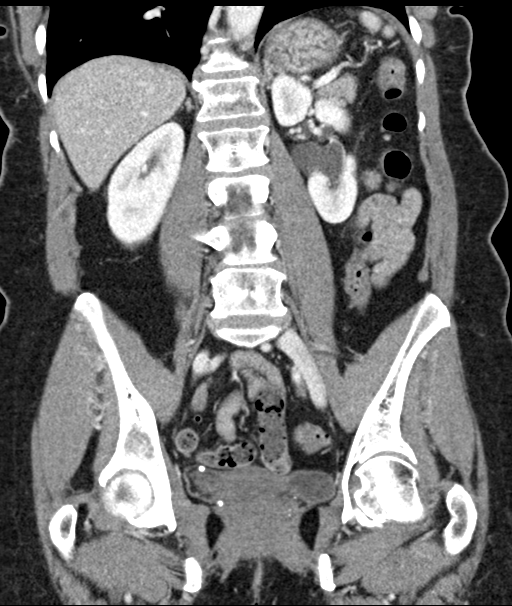

[16 of 46 positions shown; findings below may reference images not displayed]

FINDINGS: Lower chest: Atelectasis in the lung bases.

Hepatobiliary: No focal liver abnormality is seen. Status post
cholecystectomy. No biliary dilatation.

Pancreas: Unremarkable. No pancreatic ductal dilatation or
surrounding inflammatory changes.

Spleen: Normal in size without focal abnormality.

Adrenals/Urinary Tract: No adrenal gland nodules. Kidneys are
homogeneous and symmetrical. Prominent extrarenal pelvises
bilaterally. No obstructing stones or ureterectasis demonstrated.
Bladder is mostly decompressed but appears unremarkable.

Stomach/Bowel: Stomach is decompressed. Small bowel are mostly
decompressed. There is a single mildly dilated fluid-filled small
bowel loop in the anterior pelvis. The distal terminal ileum is
decompressed. Decompression limits evaluation of the wall but the
wall appears to be somewhat hyperemic. This could represent
inflammatory stricture. Consider Crohn disease. Scattered stool
throughout the colon. No colonic inflammatory changes identified.
Scattered colonic diverticula without diverticulitis. Appendix is
normal.

Vascular/Lymphatic: No significant vascular findings are present. No
enlarged abdominal or pelvic lymph nodes.

Reproductive: Uterus and bilateral adnexa are unremarkable.

Other: No abdominal wall hernia or abnormality. No abdominopelvic
ascites.

Musculoskeletal: No acute or significant osseous findings.
IMPRESSION: There is a single mildly dilated fluid-filled small bowel loop in
the anterior pelvis. The distal terminal ileum is decompressed with
suggestion of mural hyperemia. This could just be due to bowel
compression, but it may represent inflammatory stricture. Consider
Crohn disease or focal enteritis. No evidence of significant bowel
obstruction.

## 2020-10-01 DIAGNOSIS — H401132 Primary open-angle glaucoma, bilateral, moderate stage: Secondary | ICD-10-CM | POA: Diagnosis not present

## 2020-10-09 ENCOUNTER — Other Ambulatory Visit (HOSPITAL_COMMUNITY)
Admission: RE | Admit: 2020-10-09 | Discharge: 2020-10-09 | Disposition: A | Discharge status: Home / Self Care | Payer: Medicare Other | Source: Ambulatory Visit | Attending: Family Medicine | Admitting: Family Medicine

## 2020-10-09 ENCOUNTER — Other Ambulatory Visit: Payer: Self-pay | Admitting: Family Medicine

## 2020-10-09 DIAGNOSIS — Z1151 Encounter for screening for human papillomavirus (HPV): Secondary | ICD-10-CM | POA: Insufficient documentation

## 2020-10-09 DIAGNOSIS — F411 Generalized anxiety disorder: Secondary | ICD-10-CM | POA: Diagnosis not present

## 2020-10-09 DIAGNOSIS — Z124 Encounter for screening for malignant neoplasm of cervix: Secondary | ICD-10-CM | POA: Diagnosis not present

## 2020-10-09 DIAGNOSIS — E559 Vitamin D deficiency, unspecified: Secondary | ICD-10-CM | POA: Diagnosis not present

## 2020-10-09 DIAGNOSIS — Z0000 Encounter for general adult medical examination without abnormal findings: Secondary | ICD-10-CM | POA: Diagnosis not present

## 2020-10-13 LAB — CYTOLOGY - PAP
Comment: NEGATIVE
Diagnosis: NEGATIVE
High risk HPV: NEGATIVE

## 2020-11-09 ENCOUNTER — Other Ambulatory Visit: Payer: Self-pay | Admitting: Family Medicine

## 2020-11-09 DIAGNOSIS — Z1231 Encounter for screening mammogram for malignant neoplasm of breast: Secondary | ICD-10-CM

## 2020-11-09 IMAGING — CT CT ENTEROGRAPHY (ABD-PELV W/ CM)
2 of 6 series · 15 of 46 positions shown, 17 images · IV contrast (iopamidol)
Comparison: CT scan 06/19/2018

CLINICAL DATA: Followup abnormal prior CT scan 06/19/2018

Creatinine was obtained on site at [HOSPITAL] at [REDACTED].
Results: Creatinine 0.9 mg/dL.
EXAM:
CT ABDOMEN AND PELVIS WITH CONTRAST (ENTEROGRAPHY)
TECHNIQUE: Multidetector CT of the abdomen and pelvis during bolus
administration of intravenous contrast. Negative oral contrast was
given.
CONTRAST:  100mL MXUMCZ-DBB IOPAMIDOL (MXUMCZ-DBB) INJECTION 61%

[Series 4: enterography 2.00 br40 s3 cor cor · coronal · 0.61mm/px · 3 of 130 slices shown]
[im 44/130  soft-tissue]
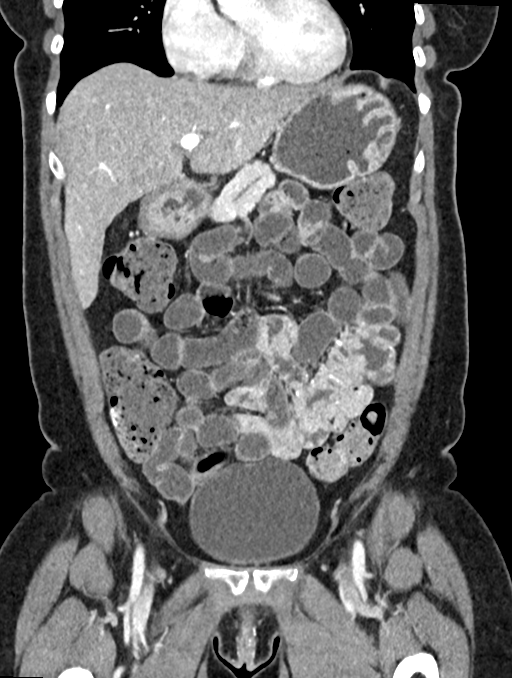
[im 58/130  soft-tissue]
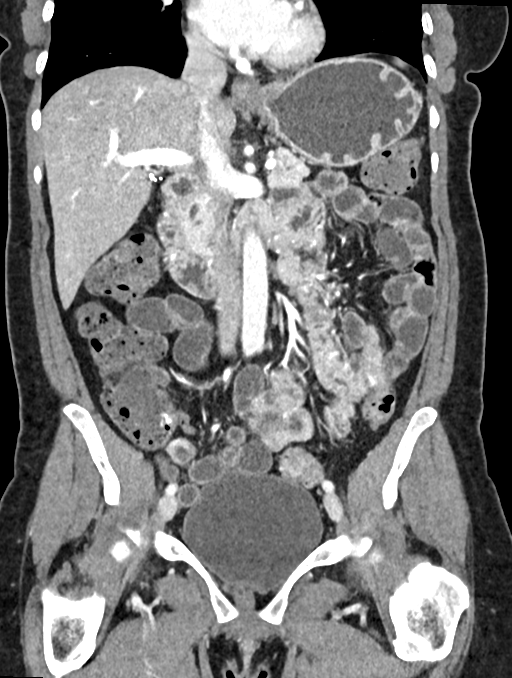
[im 72/130  soft-tissue]
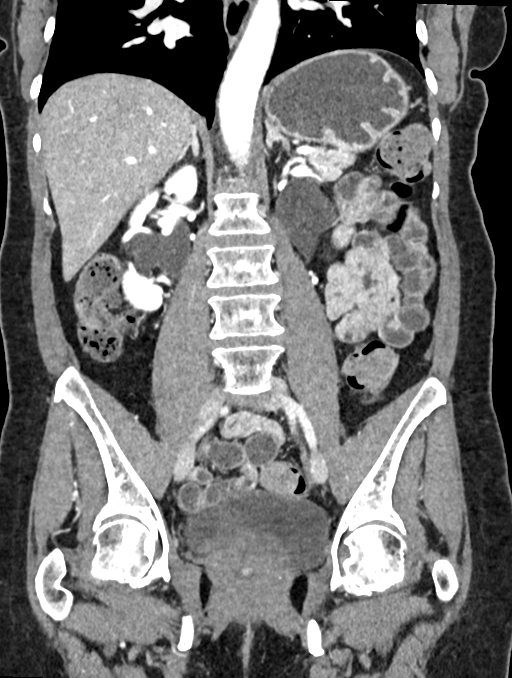

[Series 9: enterography 3.00 br40 s3 axial 3mm · axial · 0.61mm/px · z∈[+1414,+1777]mm · 12 of 139 slices shown, 14 images]
[im 9/139  soft-tissue]
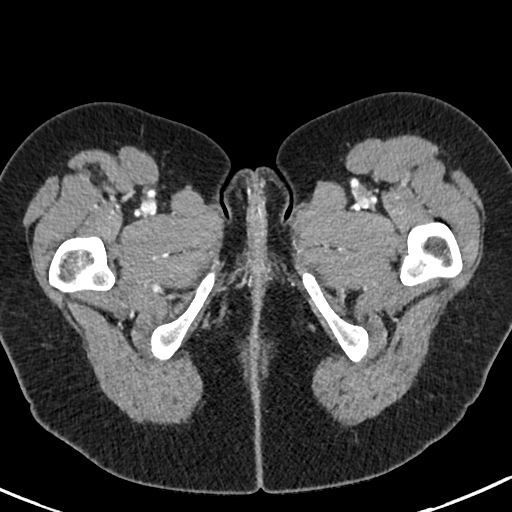
[im 9/139  bone]
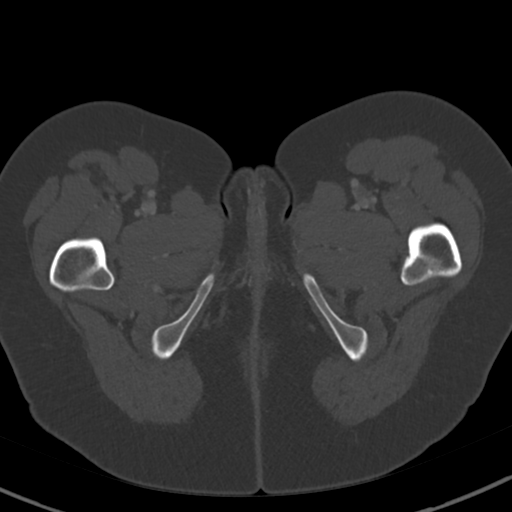
[im 18/139  soft-tissue]
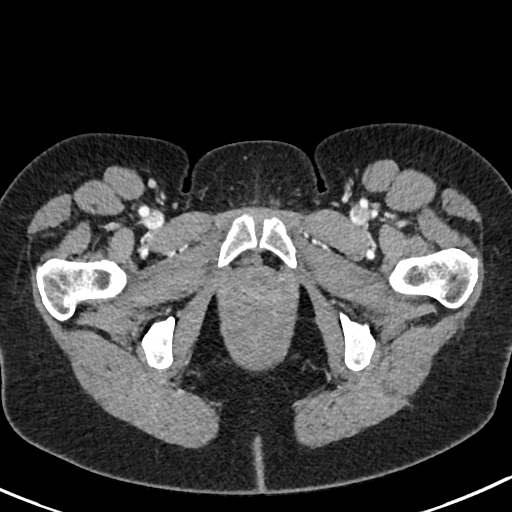
[im 35/139  soft-tissue]
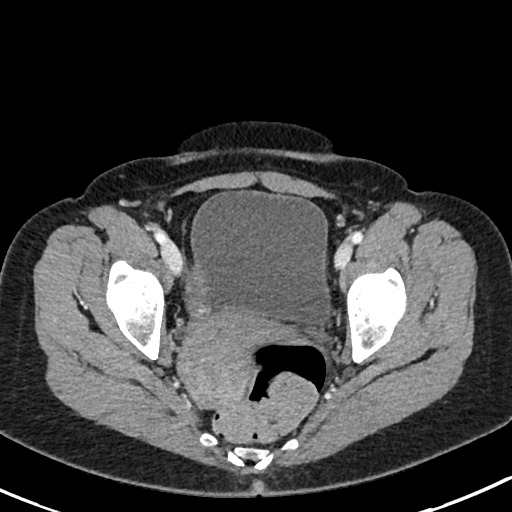
[im 44/139  soft-tissue]
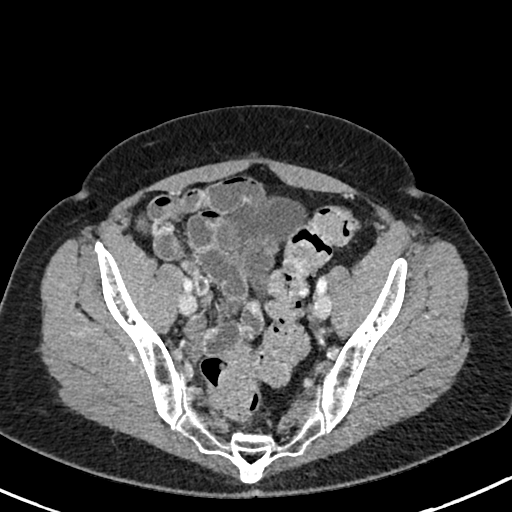
[im 52/139  soft-tissue]
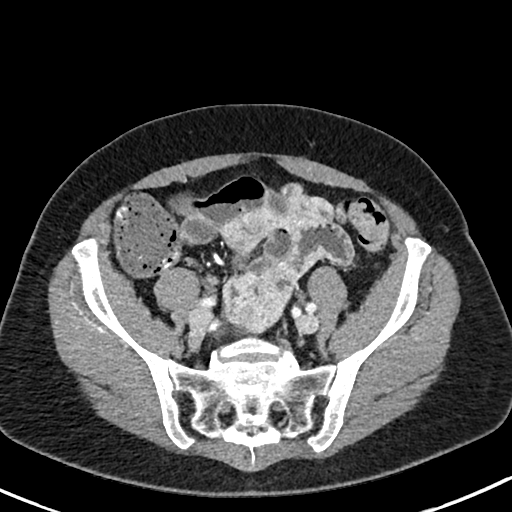
[im 61/139  soft-tissue]
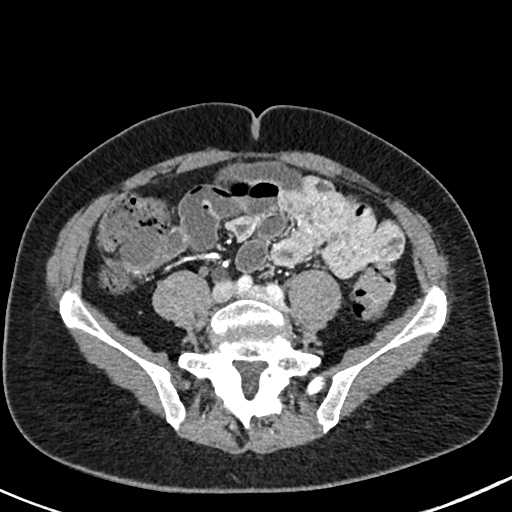
[im 78/139  soft-tissue]
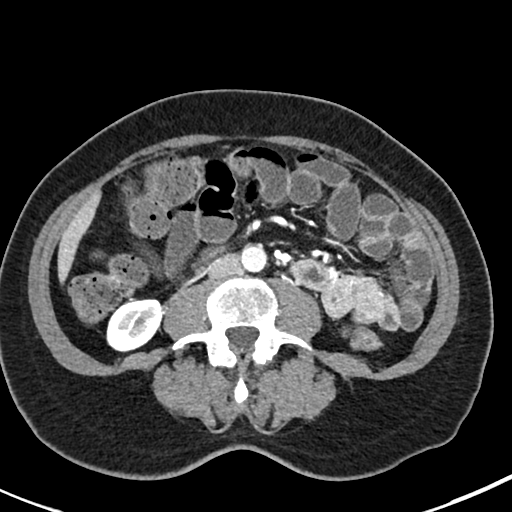
[im 87/139  soft-tissue]
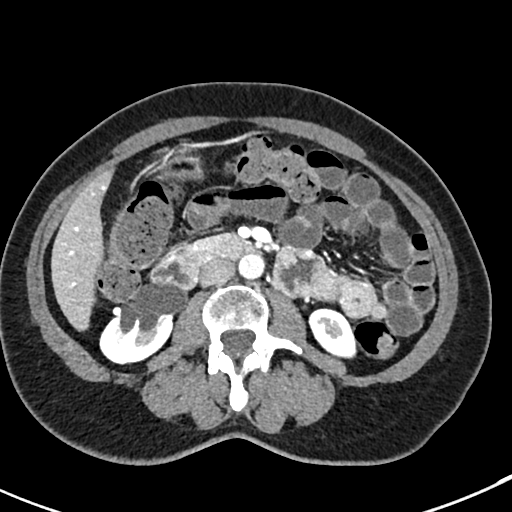
[im 95/139  soft-tissue]
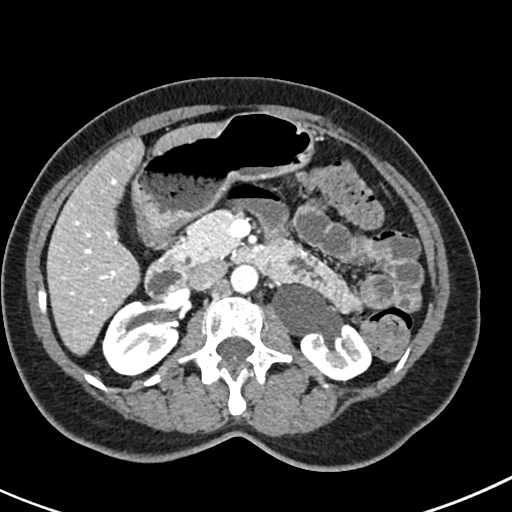
[im 95/139  bone]
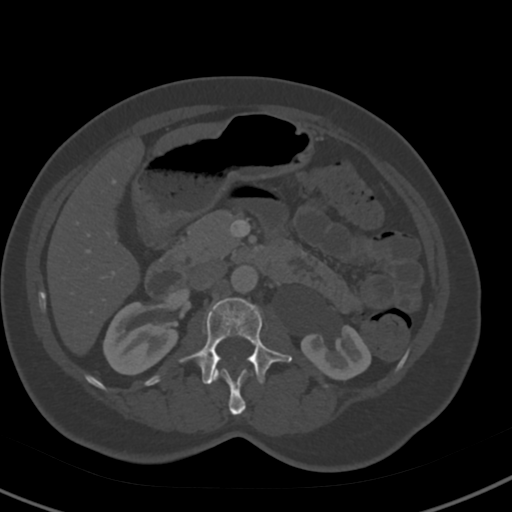
[im 104/139  soft-tissue]
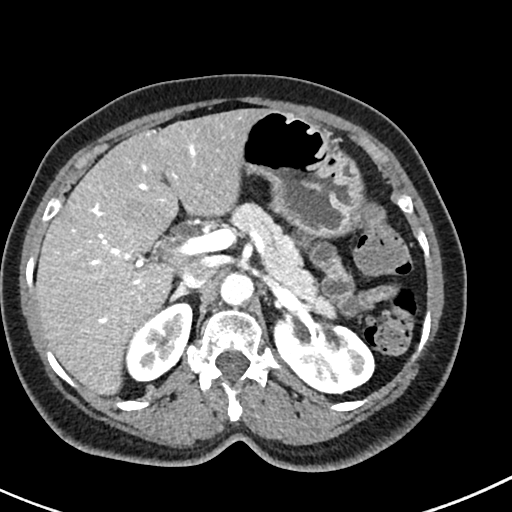
[im 121/139  soft-tissue]
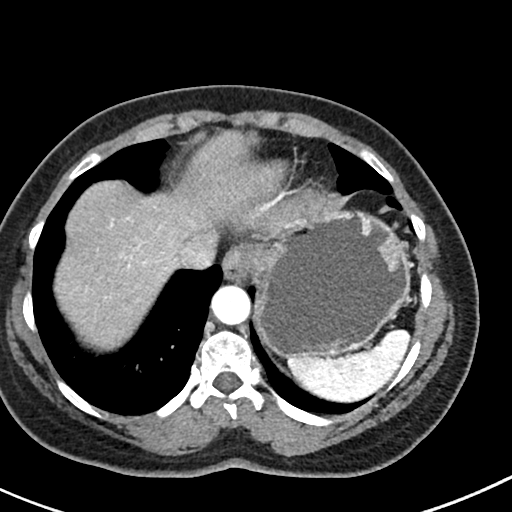
[im 130/139  soft-tissue]
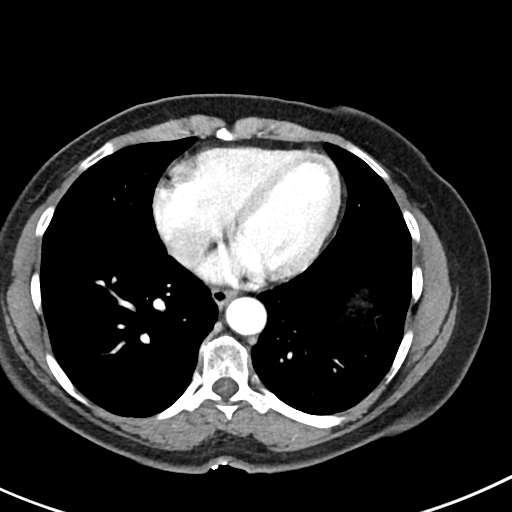

[15 of 46 positions shown; findings below may reference images not displayed]

FINDINGS: Lower chest: The lung bases are clear of acute process. No pleural
effusion or pulmonary lesions. The heart is normal in size. No
pericardial effusion. The distal esophagus and aorta are
unremarkable.

Hepatobiliary: No focal hepatic lesions or intrahepatic biliary
dilatation. The gallbladder is surgically absent. No common bile
duct dilatation.

Pancreas: No mass, inflammation or ductal dilatation.

Spleen: Normal size.  No focal lesions.

Adrenals/Urinary Tract: The adrenal glands and kidneys are
unremarkable. There are prominent bilateral extrarenal pelves and
under rotation anomalies of both kidneys. The bladder is
unremarkable.

Stomach/Bowel: The stomach, duodenum, small bowel and colon are
unremarkable. No acute inflammatory changes, mass lesions or
obstructive findings. The terminal ileum is normal. No wall
thickening or enhancement. There are areas of small bowel
contraction but no worrisome small bowel lesions or evidence of
acute inflammatory process/inflammatory bowel disease. There is a
large amount of stool throughout the colon and down into the rectum
suggesting constipation.

Vascular/Lymphatic: The aorta is normal in caliber. No dissection.
The branch vessels are patent. The major venous structures are
patent. No mesenteric or retroperitoneal mass or adenopathy. Small
scattered lymph nodes are noted.

Reproductive: The uterus and ovaries are unremarkable. Retroflexed
uterus is noted.

Other: No pelvic mass or adenopathy. No free pelvic fluid
collections. No inguinal mass or adenopathy. No abdominal wall
hernia or subcutaneous lesions.

Musculoskeletal: No significant bony findings. Advanced facet
disease noted in the lower lumbar spine with pars defects at L5 and
minimal anterolisthesis of L5.
IMPRESSION: 1. No acute abdominal/pelvic findings, mass lesions or adenopathy.
2. No findings for inflammatory bowel disease, bowel lesions or
obstruction.
3. Moderate to large amount of stool throughout the colon and down
into the rectum may suggest constipation.
4. Status post cholecystectomy.  No biliary dilatation.

## 2020-11-30 DIAGNOSIS — K5792 Diverticulitis of intestine, part unspecified, without perforation or abscess without bleeding: Secondary | ICD-10-CM | POA: Diagnosis not present

## 2021-01-04 ENCOUNTER — Ambulatory Visit
Admission: RE | Admit: 2021-01-04 | Discharge: 2021-01-04 | Disposition: A | Discharge status: Home / Self Care | Payer: Medicare Other | Source: Ambulatory Visit | Attending: Family Medicine | Admitting: Family Medicine

## 2021-01-04 ENCOUNTER — Other Ambulatory Visit: Payer: Self-pay

## 2021-01-04 DIAGNOSIS — Z1231 Encounter for screening mammogram for malignant neoplasm of breast: Secondary | ICD-10-CM | POA: Diagnosis not present

## 2021-01-14 DIAGNOSIS — H524 Presbyopia: Secondary | ICD-10-CM | POA: Diagnosis not present

## 2021-01-14 DIAGNOSIS — H401131 Primary open-angle glaucoma, bilateral, mild stage: Secondary | ICD-10-CM | POA: Diagnosis not present

## 2021-06-22 DIAGNOSIS — R1032 Left lower quadrant pain: Secondary | ICD-10-CM | POA: Diagnosis not present

## 2021-06-22 DIAGNOSIS — K2970 Gastritis, unspecified, without bleeding: Secondary | ICD-10-CM | POA: Diagnosis not present

## 2021-06-23 ENCOUNTER — Ambulatory Visit
Admission: RE | Admit: 2021-06-23 | Discharge: 2021-06-23 | Disposition: A | Discharge status: Home / Self Care | Payer: Medicare Other | Source: Ambulatory Visit | Attending: Family Medicine | Admitting: Family Medicine

## 2021-06-23 ENCOUNTER — Other Ambulatory Visit: Payer: Self-pay | Admitting: Family Medicine

## 2021-06-23 DIAGNOSIS — R1032 Left lower quadrant pain: Secondary | ICD-10-CM | POA: Diagnosis not present

## 2021-07-05 DIAGNOSIS — R059 Cough, unspecified: Secondary | ICD-10-CM | POA: Diagnosis not present

## 2021-07-05 DIAGNOSIS — J069 Acute upper respiratory infection, unspecified: Secondary | ICD-10-CM | POA: Diagnosis not present

## 2021-07-05 DIAGNOSIS — U071 COVID-19: Secondary | ICD-10-CM | POA: Diagnosis not present

## 2021-07-05 DIAGNOSIS — R519 Headache, unspecified: Secondary | ICD-10-CM | POA: Diagnosis not present

## 2021-07-22 DIAGNOSIS — H401131 Primary open-angle glaucoma, bilateral, mild stage: Secondary | ICD-10-CM | POA: Diagnosis not present

## 2021-10-04 DIAGNOSIS — R3121 Asymptomatic microscopic hematuria: Secondary | ICD-10-CM | POA: Diagnosis not present

## 2021-10-04 DIAGNOSIS — N1330 Unspecified hydronephrosis: Secondary | ICD-10-CM | POA: Diagnosis not present

## 2021-10-14 DIAGNOSIS — F5101 Primary insomnia: Secondary | ICD-10-CM | POA: Diagnosis not present

## 2021-10-14 DIAGNOSIS — Z0000 Encounter for general adult medical examination without abnormal findings: Secondary | ICD-10-CM | POA: Diagnosis not present

## 2021-10-14 DIAGNOSIS — E559 Vitamin D deficiency, unspecified: Secondary | ICD-10-CM | POA: Diagnosis not present

## 2021-10-14 DIAGNOSIS — R198 Other specified symptoms and signs involving the digestive system and abdomen: Secondary | ICD-10-CM | POA: Diagnosis not present

## 2021-10-14 DIAGNOSIS — M8588 Other specified disorders of bone density and structure, other site: Secondary | ICD-10-CM | POA: Diagnosis not present

## 2021-10-14 DIAGNOSIS — F411 Generalized anxiety disorder: Secondary | ICD-10-CM | POA: Diagnosis not present

## 2021-11-26 DIAGNOSIS — R319 Hematuria, unspecified: Secondary | ICD-10-CM | POA: Diagnosis not present

## 2021-11-29 ENCOUNTER — Other Ambulatory Visit: Payer: Self-pay | Admitting: Family Medicine

## 2021-11-29 DIAGNOSIS — Z1231 Encounter for screening mammogram for malignant neoplasm of breast: Secondary | ICD-10-CM

## 2022-01-05 ENCOUNTER — Ambulatory Visit
Admission: RE | Admit: 2022-01-05 | Discharge: 2022-01-05 | Disposition: A | Discharge status: Home / Self Care | Payer: Medicare Other | Source: Ambulatory Visit | Attending: Family Medicine | Admitting: Family Medicine

## 2022-01-05 DIAGNOSIS — Z1231 Encounter for screening mammogram for malignant neoplasm of breast: Secondary | ICD-10-CM | POA: Diagnosis not present

## 2022-01-19 DIAGNOSIS — H401131 Primary open-angle glaucoma, bilateral, mild stage: Secondary | ICD-10-CM | POA: Diagnosis not present

## 2022-07-25 DIAGNOSIS — H401131 Primary open-angle glaucoma, bilateral, mild stage: Secondary | ICD-10-CM | POA: Diagnosis not present

## 2022-09-01 ENCOUNTER — Other Ambulatory Visit: Payer: Self-pay

## 2022-09-01 ENCOUNTER — Emergency Department (HOSPITAL_BASED_OUTPATIENT_CLINIC_OR_DEPARTMENT_OTHER): Payer: Medicare Other

## 2022-09-01 ENCOUNTER — Emergency Department (HOSPITAL_BASED_OUTPATIENT_CLINIC_OR_DEPARTMENT_OTHER)
Admission: EM | Admit: 2022-09-01 | Discharge: 2022-09-01 | Disposition: A | Discharge status: Home / Self Care | Payer: Medicare Other | Attending: Emergency Medicine | Admitting: Emergency Medicine

## 2022-09-01 ENCOUNTER — Encounter (HOSPITAL_BASED_OUTPATIENT_CLINIC_OR_DEPARTMENT_OTHER): Payer: Self-pay

## 2022-09-01 DIAGNOSIS — R7401 Elevation of levels of liver transaminase levels: Secondary | ICD-10-CM | POA: Diagnosis not present

## 2022-09-01 DIAGNOSIS — R1013 Epigastric pain: Secondary | ICD-10-CM | POA: Insufficient documentation

## 2022-09-01 DIAGNOSIS — R739 Hyperglycemia, unspecified: Secondary | ICD-10-CM | POA: Diagnosis not present

## 2022-09-01 DIAGNOSIS — N1330 Unspecified hydronephrosis: Secondary | ICD-10-CM | POA: Diagnosis not present

## 2022-09-01 DIAGNOSIS — R109 Unspecified abdominal pain: Secondary | ICD-10-CM | POA: Diagnosis not present

## 2022-09-01 LAB — URINALYSIS, ROUTINE W REFLEX MICROSCOPIC
Bacteria, UA: NONE SEEN
Bilirubin Urine: NEGATIVE
Glucose, UA: NEGATIVE mg/dL
Ketones, ur: NEGATIVE mg/dL
Nitrite: NEGATIVE
Protein, ur: NEGATIVE mg/dL
Specific Gravity, Urine: 1.017 (ref 1.005–1.030)
pH: 5.5 (ref 5.0–8.0)

## 2022-09-01 LAB — COMPREHENSIVE METABOLIC PANEL
ALT: 49 U/L — ABNORMAL HIGH (ref 0–44)
AST: 35 U/L (ref 15–41)
Albumin: 3.9 g/dL (ref 3.5–5.0)
Alkaline Phosphatase: 69 U/L (ref 38–126)
Anion gap: 9 (ref 5–15)
BUN: 11 mg/dL (ref 8–23)
CO2: 25 mmol/L (ref 22–32)
Calcium: 9.4 mg/dL (ref 8.9–10.3)
Chloride: 105 mmol/L (ref 98–111)
Creatinine, Ser: 1.06 mg/dL — ABNORMAL HIGH (ref 0.44–1.00)
GFR, Estimated: 57 mL/min — ABNORMAL LOW (ref >60)
Glucose, Bld: 118 mg/dL — ABNORMAL HIGH (ref 70–99)
Potassium: 4.2 mmol/L (ref 3.5–5.1)
Sodium: 139 mmol/L (ref 135–145)
Total Bilirubin: 0.7 mg/dL (ref 0.3–1.2)
Total Protein: 7.3 g/dL (ref 6.5–8.1)

## 2022-09-01 LAB — LIPASE, BLOOD: Lipase: 14 U/L (ref 11–51)

## 2022-09-01 LAB — CBC
HCT: 43.3 % (ref 36.0–46.0)
Hemoglobin: 14.3 g/dL (ref 12.0–15.0)
MCH: 29.5 pg (ref 26.0–34.0)
MCHC: 33 g/dL (ref 30.0–36.0)
MCV: 89.3 fL (ref 80.0–100.0)
Platelets: 283 10*3/uL (ref 150–400)
RBC: 4.85 MIL/uL (ref 3.87–5.11)
RDW: 14.3 % (ref 11.5–15.5)
WBC: 5.6 10*3/uL (ref 4.0–10.5)
nRBC: 0 % (ref 0.0–0.2)

## 2022-09-01 MED ORDER — LACTATED RINGERS IV BOLUS
500.0000 mL | Freq: Once | INTRAVENOUS | Status: AC
  Administered 2022-09-01: 500 mL via INTRAVENOUS

## 2022-09-01 MED ORDER — OMEPRAZOLE 20 MG PO CPDR: 20.0000 mg | DELAYED_RELEASE_CAPSULE | Freq: Every day | ORAL | 0 refills | Status: DC

## 2022-09-01 MED ORDER — ONDANSETRON HCL 4 MG/2ML IJ SOLN
4.0000 mg | Freq: Once | INTRAMUSCULAR | Status: AC
  Administered 2022-09-01: 4 mg via INTRAVENOUS
  Filled 2022-09-01: qty 2

## 2022-09-01 MED ORDER — IOHEXOL 350 MG/ML SOLN: 100.0000 mL | Freq: Once | INTRAVENOUS | Status: DC | PRN

## 2022-09-01 MED ORDER — IOHEXOL 300 MG/ML  SOLN
100.0000 mL | Freq: Once | INTRAMUSCULAR | Status: AC | PRN
  Administered 2022-09-01: 75 mL via INTRAVENOUS

## 2022-09-01 MED ORDER — FENTANYL CITRATE PF 50 MCG/ML IJ SOSY
25.0000 ug | PREFILLED_SYRINGE | Freq: Once | INTRAMUSCULAR | Status: AC
  Administered 2022-09-01: 25 ug via INTRAVENOUS
  Filled 2022-09-01: qty 1

## 2022-09-01 NOTE — ED Notes (Signed)
Patient verbalizes understanding of discharge instructions. Opportunity for questioning and answers were provided. Patient discharged from ED.  °

## 2022-09-01 NOTE — ED Triage Notes (Signed)
Onset Tuesday of epigastric pain Now having cramping pain that comes and goes.  Nausea no vomiting

## 2022-09-01 NOTE — ED Provider Notes (Signed)
[facility] Provider Note   CSN: [id number] Arrival date & time: 09/01/22  0941     History  Chief Complaint  Patient presents with   Abdominal Pain    Kimberly Farrell is a 67 y.o. female.  HPI 67 yo female with epigastric pain, sharp and stabbing x 2 days.  Now constant.  Patient with nausea and no appetite, but no vomiting.  Normal bm this am.  No fever, diarrhea, uti symptoms. No meds or other interventions. Similar symptoms years ago,  patient states she has had diverticulitis in past. Review of record with ED visit 06/25/18 elevated lipase      Home Medications Prior to Admission medications   Medication Sig Start Date End Date Taking? Authorizing Provider  latanoprost (XALATAN) 0.005 % ophthalmic solution Place 1 drop into both eyes every evening. 07/25/22  Yes [provider]  LORazepam (ATIVAN) 0.5 MG tablet Take 0.5 mg by mouth 2 (two) times daily as needed. 08/30/22  Yes [provider]  omeprazole (PRILOSEC) 20 MG capsule Take 1 capsule (20 mg total) by mouth daily. 09/01/22  Yes Pattricia Boss, MD  acetaminophen (TYLENOL) 325 MG tablet Take 650 mg by mouth every 6 (six) hours as needed.    [provider]  ibuprofen (ADVIL) 800 MG tablet Take 1 tablet (800 mg total) by mouth 3 (three) times daily. 08/17/20   Wieters, Hallie C, PA-C  TRAVATAN Z 0.004 % SOLN ophthalmic solution Place 1 drop into both eyes daily. 05/18/18   [provider]  esomeprazole (NEXIUM) 20 MG capsule Take 20 mg by mouth daily as needed (reflux).  08/17/20  [provider]      Allergies    Patient has no known allergies.    Review of Systems   Review of Systems  Physical Exam Updated Vital Signs BP (!) 117/100 (BP Location: Right Arm)   Pulse (!) 55   Temp 97.7 F (36.5 C) (Oral)   Resp 18   Ht 1.499 m (4\' 11" )   Wt 59.9 kg   SpO2 100%   BMI 26.66 kg/m  Physical Exam Vitals reviewed.   Constitutional:      Appearance: She is well-developed.  HENT:     Head: Normocephalic.     Mouth/Throat:     Mouth: Mucous membranes are moist.  Eyes:     Extraocular Movements: Extraocular movements intact.  Cardiovascular:     Rate and Rhythm: Normal rate and regular rhythm.  Pulmonary:     Effort: Pulmonary effort is normal.     Breath sounds: Normal breath sounds.  Abdominal:     General: Abdomen is flat. Bowel sounds are increased.     Palpations: Abdomen is soft.     Tenderness: There is abdominal tenderness in the epigastric area. There is no guarding or rebound.  Skin:    General: Skin is warm and dry.     Capillary Refill: Capillary refill takes less than 2 seconds.  Neurological:     General: No focal deficit present.     Mental Status: She is alert.  Psychiatric:        Mood and Affect: Mood normal.     ED Results / Procedures / Treatments   Labs (all labs ordered are listed, but only abnormal results are displayed) Labs Reviewed  COMPREHENSIVE METABOLIC PANEL - Abnormal; Notable for the following components:      Result Value   Glucose, Bld 118 (*)  Creatinine, Ser 1.06 (*)    ALT 49 (*)    GFR, Estimated 57 (*)    All other components within normal limits  URINALYSIS, ROUTINE W REFLEX MICROSCOPIC - Abnormal; Notable for the following components:   Hgb urine dipstick MODERATE (*)    Leukocytes,Ua TRACE (*)    All other components within normal limits  LIPASE, BLOOD  CBC    EKG None  Radiology CT ABDOMEN PELVIS W CONTRAST  Result Date: 09/01/2022 CLINICAL DATA:  Epigastric pain EXAM: CT ABDOMEN AND PELVIS WITH CONTRAST TECHNIQUE: Multidetector CT imaging of the abdomen and pelvis was performed using the standard protocol following bolus administration of intravenous contrast. RADIATION DOSE REDUCTION: This exam was performed according to the departmental dose-optimization program which includes automated exposure control, adjustment of the mA  and/or kV according to patient size and/or use of iterative reconstruction technique. CONTRAST:  26mL OMNIPAQUE IOHEXOL 300 MG/ML  SOLN COMPARISON:  06/23/2021 FINDINGS: Lower chest: No acute abnormality Hepatobiliary: No focal liver abnormality is seen. Status post cholecystectomy. No biliary dilatation. Pancreas: No focal abnormality or ductal dilatation. Spleen: No focal abnormality.  Normal size. Adrenals/Urinary Tract: Adrenal glands normal. No renal mass. Continued bilateral hydronephrosis, stable since prior study. No stones. Urinary bladder unremarkable. Stomach/Bowel: Scattered colonic diverticula. No active diverticulitis. Normal appendix. Vascular/Lymphatic: No evidence of aneurysm or adenopathy. Reproductive: Uterus and adnexa unremarkable.  No mass. Other: No free fluid or free air. Musculoskeletal: No acute bony abnormality. IMPRESSION: No acute findings in the abdomen or pelvis. 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 Scattered colonic diverticulosis.  No active diverticulitis. Electronically Signed   By: Rolm Baptise M.D.   On: 09/01/2022 12:05    Procedures Procedures    Medications Ordered in ED Medications  lactated ringers bolus 500 mL (0 mLs Intravenous Stopped 09/01/22 1050)  fentaNYL (SUBLIMAZE) injection 25 mcg (25 mcg Intravenous Given 09/01/22 1017)  ondansetron (ZOFRAN) injection 4 mg (4 mg Intravenous Given 09/01/22 1016)  iohexol (OMNIPAQUE) 300 MG/ML solution 100 mL (75 mLs Intravenous Contrast Given 09/01/22 1117)    ED Course/ Medical Decision Making/ A&P Clinical Course as of 09/01/22 1218  Thu Sep 01, 2022  [id number] Complete metabolic panel is reviewed and interpreted and is significant for slightly elevated creatinine at 1.06 from baseline of 0.9 and elevated ALT at 49 otherwise within normal limits Lipase is reviewed interpreted as within normal limits CBC is reviewed and interpreted and within normal limits [DR]  1211 ET  reviewed interpreted and no acute abnormality noted and radiologist interpretation concurs [DR]    Clinical Course User Index [DR] Pattricia Boss, MD                             Medical Decision Making Amount and/or Complexity of Data Reviewed Labs: ordered. Radiology: ordered.  Risk Prescription drug management.   67 year old female presents today complaining of sharp epigastric pain for several days Patient has some epigastric tenderness on palpation Differential diagnosis includes but is not limited to gastritis, pancreatitis, mall bowel obstruction, UTI, other intra-abdominal infections, diverticulitis Workup here in the emergency department includes CBC that is normal Complete metabolic panel with mildly elevated creatinine, glucose and ALT Urinalysis that is significant for 6-10 white blood cells and 5 red blood cells no bacteria and some squamous epithelial cells CT of the abdomen is obtained shows no acute findings in the abdomen or pelvis Discussed all above results with patient. Plan  omeprazole.  She is followed by Dr. Dema Severin.  Have advised her to call Dr. Orest Dikes office today for recheck next week.  Patient has been seen by Dr. Deno Etienne in the past for GI. Patient advised that if her symptoms worsen she can return here anytime.        Final Clinical Impression(s) / ED Diagnoses Final diagnoses:  None    Rx / DC Orders ED Discharge Orders          Ordered    omeprazole (PRILOSEC) 20 MG capsule  Daily        09/01/22 1217              Pattricia Boss, MD 09/01/22 1218

## 2022-09-01 NOTE — Discharge Instructions (Signed)
Please take omeprazole as prescribed Please eat a bland diet and do not eat anything spicy Call Dr. Orest Dikes office today for recheck next week Return to the emergency department if you are having worsening symptoms, new symptoms, fever, or other new problems.

## 2022-10-28 DIAGNOSIS — Z0000 Encounter for general adult medical examination without abnormal findings: Secondary | ICD-10-CM | POA: Diagnosis not present

## 2022-10-28 DIAGNOSIS — F5101 Primary insomnia: Secondary | ICD-10-CM | POA: Diagnosis not present

## 2022-10-28 DIAGNOSIS — N289 Disorder of kidney and ureter, unspecified: Secondary | ICD-10-CM | POA: Diagnosis not present

## 2022-10-28 DIAGNOSIS — E559 Vitamin D deficiency, unspecified: Secondary | ICD-10-CM | POA: Diagnosis not present

## 2022-10-28 DIAGNOSIS — M8588 Other specified disorders of bone density and structure, other site: Secondary | ICD-10-CM | POA: Diagnosis not present

## 2022-10-28 DIAGNOSIS — F411 Generalized anxiety disorder: Secondary | ICD-10-CM | POA: Diagnosis not present

## 2022-10-28 DIAGNOSIS — K219 Gastro-esophageal reflux disease without esophagitis: Secondary | ICD-10-CM | POA: Diagnosis not present

## 2022-11-03 ENCOUNTER — Other Ambulatory Visit: Payer: Self-pay | Admitting: Family Medicine

## 2022-11-03 DIAGNOSIS — M8580 Other specified disorders of bone density and structure, unspecified site: Secondary | ICD-10-CM

## 2022-11-03 DIAGNOSIS — Z1231 Encounter for screening mammogram for malignant neoplasm of breast: Secondary | ICD-10-CM

## 2022-11-04 ENCOUNTER — Emergency Department (HOSPITAL_BASED_OUTPATIENT_CLINIC_OR_DEPARTMENT_OTHER)
Admission: EM | Admit: 2022-11-04 | Discharge: 2022-11-04 | Disposition: A | Discharge status: Home / Self Care | Payer: Medicare Other | Attending: Emergency Medicine | Admitting: Emergency Medicine

## 2022-11-04 ENCOUNTER — Other Ambulatory Visit: Payer: Self-pay

## 2022-11-04 ENCOUNTER — Emergency Department (HOSPITAL_BASED_OUTPATIENT_CLINIC_OR_DEPARTMENT_OTHER): Payer: Medicare Other

## 2022-11-04 DIAGNOSIS — W010XXA Fall on same level from slipping, tripping and stumbling without subsequent striking against object, initial encounter: Secondary | ICD-10-CM | POA: Diagnosis not present

## 2022-11-04 DIAGNOSIS — S63502A Unspecified sprain of left wrist, initial encounter: Secondary | ICD-10-CM | POA: Diagnosis not present

## 2022-11-04 DIAGNOSIS — W19XXXA Unspecified fall, initial encounter: Secondary | ICD-10-CM

## 2022-11-04 DIAGNOSIS — M79642 Pain in left hand: Secondary | ICD-10-CM | POA: Diagnosis not present

## 2022-11-04 DIAGNOSIS — S638X2A Sprain of other part of left wrist and hand, initial encounter: Secondary | ICD-10-CM | POA: Diagnosis not present

## 2022-11-04 DIAGNOSIS — M7989 Other specified soft tissue disorders: Secondary | ICD-10-CM | POA: Insufficient documentation

## 2022-11-04 DIAGNOSIS — S6992XA Unspecified injury of left wrist, hand and finger(s), initial encounter: Secondary | ICD-10-CM | POA: Diagnosis not present

## 2022-11-04 MED ORDER — OXYCODONE-ACETAMINOPHEN 5-325 MG PO TABS
2.0000 | ORAL_TABLET | Freq: Once | ORAL | Status: AC
  Administered 2022-11-04: 2 via ORAL
  Filled 2022-11-04: qty 2

## 2022-11-04 NOTE — ED Triage Notes (Signed)
Pt reports she tripped on her flip flop tonight and put her left hand out to catch her fall, she now has pain in the left palms and fingers, mild pain in the left wrist. Abrasion to the left elbow.

## 2022-11-04 NOTE — Discharge Instructions (Addendum)
The x-ray of your wrist did not show any breaks or issues related to your fall. You have likely sprained your wrist. You may brace you and care for it at home using RICE technique provided. You may take over the counter Tylenol or ibuprofen as needed for pain. For any continued symptoms, I provided orthopedics who you may see in clinic for further evaluation. For any new injury or worsening symptoms, return to ED for re-evaluation.

## 2022-11-04 NOTE — ED Provider Notes (Signed)
Kimberly Farrell EMERGENCY DEPARTMENT AT [facility] Provider Note   CSN: [id number] Arrival date & time: 11/04/22  1854     History  Chief Complaint  Patient presents with   Kimberly Farrell is a 67 y.o. female who presents to the ED complaining of left hand pain from mechanical fall.  She states that she tripped on her flip-flop and fell with an outstretched hand bracing herself.  She did not hit her head or otherwise injure herself.  She denies paresthesias.  No previous injury to this hand.      Home Medications Prior to Admission medications   Medication Sig Start Date End Date Taking? Authorizing Provider  latanoprost (XALATAN) 0.005 % ophthalmic solution Place 1 drop into both eyes every evening. 07/25/22  Yes [provider]  omeprazole (PRILOSEC) 20 MG capsule Take 1 capsule (20 mg total) by mouth daily. 09/01/22  Yes Ray, Duwayne Heck, MD  TRAVATAN Z 0.004 % SOLN ophthalmic solution Place 1 drop into both eyes daily. 05/18/18  Yes [provider]  acetaminophen (TYLENOL) 325 MG tablet Take 650 mg by mouth every 6 (six) hours as needed.    [provider]  ibuprofen (ADVIL) 800 MG tablet Take 1 tablet (800 mg total) by mouth 3 (three) times daily. 08/17/20   Wieters, Hallie C, PA-C  LORazepam (ATIVAN) 0.5 MG tablet Take 0.5 mg by mouth 2 (two) times daily as needed. 08/30/22   [provider]  esomeprazole (NEXIUM) 20 MG capsule Take 20 mg by mouth daily as needed (reflux).  08/17/20  [provider]      Allergies    Patient has no known allergies.    Review of Systems   Review of Systems  All other systems reviewed and are negative.   Physical Exam Updated Vital Signs BP 135/82 (BP Location: Right Arm)   Pulse 69   Temp 98.7 F (37.1 C) (Oral)   Resp 16   SpO2 100%  Physical Exam Vitals and nursing note reviewed.  Constitutional:      General: She is not in acute distress.    Appearance: Normal appearance. She  is not ill-appearing or toxic-appearing.  HENT:     Head: Normocephalic and atraumatic.     Mouth/Throat:     Mouth: Mucous membranes are moist.  Eyes:     Conjunctiva/sclera: Conjunctivae normal.  Cardiovascular:     Rate and Rhythm: Normal rate and regular rhythm.     Heart sounds: No murmur heard. Pulmonary:     Effort: Pulmonary effort is normal.     Breath sounds: Normal breath sounds.  Musculoskeletal:     Cervical back: Normal range of motion and neck supple. No rigidity.     Comments: Tenderness diffusely to the left second, third, and fourth digits greatest at the second digit, range of motion of the hand, wrist, and all digits intact, neurovascularly intact.  Soft compartments, no significant wounds or active bleeding  Skin:    General: Skin is warm and dry.     Capillary Refill: Capillary refill takes less than 2 seconds.  Neurological:     Mental Status: She is alert. Mental status is at baseline.  Psychiatric:        Behavior: Behavior normal.     ED Results / Procedures / Treatments   Labs (all labs ordered are listed, but only abnormal results are displayed) Labs Reviewed - No data to display  EKG None  Radiology DG Hand  Complete Left  Result Date: 11/04/2022 CLINICAL DATA:  Fall with pain in the left hand. EXAM: LEFT HAND - COMPLETE 3 VIEW COMPARISON:  None Available. FINDINGS: Faint calcification at the base of the thumb without donor site, presumably chronic. Mild spurring at the interphalangeal joints with chronic and corticated fragmentation at the second digit middle phalanx at the DIP joint. IMPRESSION: Faint calcification adjacent to the thumb base but no donor site to imply acute fracture. No suspected fracture and no subluxation throughout the hand. Electronically Signed   By: Tiburcio Pea M.D.   On: 11/04/2022 19:55   DG Wrist Complete Left  Result Date: 11/04/2022 CLINICAL DATA:  Fall with left hand pain. EXAM: LEFT WRIST - COMPLETE 3 VIEW  COMPARISON:  None Available. FINDINGS: Negative for fracture line or subluxation. Faint calcification at the base of the thumb without donor site, presumably chronic. IMPRESSION: Negative for wrist fracture or subluxation. Electronically Signed   By: Tiburcio Pea M.D.   On: 11/04/2022 19:50    Procedures Procedures    Medications Ordered in ED Medications  oxyCODONE-acetaminophen (PERCOCET/ROXICET) 5-325 MG per tablet 2 tablet (2 tablets Oral Given 11/04/22 2042)    ED Course/ Medical Decision Making/ A&P                             Medical Decision Making Amount and/or Complexity of Data Reviewed Radiology: ordered. Decision-making details documented in ED Course.  Risk Prescription drug management.   Medical Decision Making:   Kimberly Farrell is a 67 y.o. female who presented to the ED today with hand pain detailed above.     Complete initial physical exam performed, notably the patient was in NAD. Tenderness diffusely to the left second, third, and fourth digits greatest at the second digit, range of motion of the hand, wrist, and all digits intact, neurovascularly intact.  Soft compartments, no significant wounds or active bleeding.    Reviewed and confirmed nursing documentation for past medical history, family history, social history.    Initial Assessment:   With the patient's presentation, differential diagnosis includes but is not limited to fracture, dislocation, sprain, strain, contusion, compartment syndrome, laceration. This is most consistent with an acute complicated illness  Initial Plan:  X-ray to evaluate for bony pathology Symptomatic management Objective evaluation as below reviewed   Initial Study Results:   Radiology:  All images reviewed independently. Agree with radiology report at this time.   DG Hand Complete Left  Result Date: 11/04/2022 CLINICAL DATA:  Fall with pain in the left hand. EXAM: LEFT HAND - COMPLETE 3 VIEW COMPARISON:  None  Available. FINDINGS: Faint calcification at the base of the thumb without donor site, presumably chronic. Mild spurring at the interphalangeal joints with chronic and corticated fragmentation at the second digit middle phalanx at the DIP joint. IMPRESSION: Faint calcification adjacent to the thumb base but no donor site to imply acute fracture. No suspected fracture and no subluxation throughout the hand. Electronically Signed   By: Tiburcio Pea M.D.   On: 11/04/2022 19:55   DG Wrist Complete Left  Result Date: 11/04/2022 CLINICAL DATA:  Fall with left hand pain. EXAM: LEFT WRIST - COMPLETE 3 VIEW COMPARISON:  None Available. FINDINGS: Negative for fracture line or subluxation. Faint calcification at the base of the thumb without donor site, presumably chronic. IMPRESSION: Negative for wrist fracture or subluxation. Electronically Signed   By: Tiburcio Pea M.D.   On:  11/04/2022 19:50     Final Assessment and Plan:   68 year old female presents to the ED complaining of fall on outstretched left hand.  Fall was mechanical.  No obvious deformity.  Tenderness mostly over the digits.  Neurovascularly intact with good range of motion.  X-rays negative for acute findings.  No head injury or other complaint.  Provided with wrist brace to assist with pain and orthopedics follow-up if needed.  Strict ED return precautions given, all questions answered, and stable for discharge.   Clinical Impression:  1. Sprain of left wrist, initial encounter   2. Fall, initial encounter      Discharge           Final Clinical Impression(s) / ED Diagnoses Final diagnoses:  Sprain of left wrist, initial encounter  Fall, initial encounter    Rx / DC Orders ED Discharge Orders     None         Richardson Dopp 11/04/22 2148    Rondel Baton, MD 11/07/22 [phone]

## 2022-11-14 DIAGNOSIS — R143 Flatulence: Secondary | ICD-10-CM | POA: Diagnosis not present

## 2022-11-14 DIAGNOSIS — R1013 Epigastric pain: Secondary | ICD-10-CM | POA: Diagnosis not present

## 2022-11-16 DIAGNOSIS — M79642 Pain in left hand: Secondary | ICD-10-CM | POA: Diagnosis not present

## 2022-11-24 DIAGNOSIS — M79642 Pain in left hand: Secondary | ICD-10-CM | POA: Diagnosis not present

## 2022-12-01 DIAGNOSIS — M79642 Pain in left hand: Secondary | ICD-10-CM | POA: Diagnosis not present

## 2022-12-06 DIAGNOSIS — R1013 Epigastric pain: Secondary | ICD-10-CM | POA: Diagnosis not present

## 2022-12-06 DIAGNOSIS — K2930 Chronic superficial gastritis without bleeding: Secondary | ICD-10-CM | POA: Diagnosis not present

## 2022-12-13 DIAGNOSIS — M79642 Pain in left hand: Secondary | ICD-10-CM | POA: Diagnosis not present

## 2022-12-15 DIAGNOSIS — K2930 Chronic superficial gastritis without bleeding: Secondary | ICD-10-CM | POA: Diagnosis not present

## 2023-01-09 ENCOUNTER — Ambulatory Visit
Admission: RE | Admit: 2023-01-09 | Discharge: 2023-01-09 | Disposition: A | Discharge status: Home / Self Care | Payer: Medicare Other | Source: Ambulatory Visit | Attending: Family Medicine | Admitting: Family Medicine

## 2023-01-09 DIAGNOSIS — Z1231 Encounter for screening mammogram for malignant neoplasm of breast: Secondary | ICD-10-CM | POA: Diagnosis not present

## 2023-01-23 DIAGNOSIS — H401131 Primary open-angle glaucoma, bilateral, mild stage: Secondary | ICD-10-CM | POA: Diagnosis not present

## 2023-05-22 ENCOUNTER — Ambulatory Visit
Admission: RE | Admit: 2023-05-22 | Discharge: 2023-05-22 | Disposition: A | Discharge status: Home / Self Care | Payer: Medicare Other | Source: Ambulatory Visit | Attending: Family Medicine | Admitting: Family Medicine

## 2023-05-22 DIAGNOSIS — E2839 Other primary ovarian failure: Secondary | ICD-10-CM | POA: Diagnosis not present

## 2023-05-22 DIAGNOSIS — M8588 Other specified disorders of bone density and structure, other site: Secondary | ICD-10-CM | POA: Diagnosis not present

## 2023-05-22 DIAGNOSIS — M8580 Other specified disorders of bone density and structure, unspecified site: Secondary | ICD-10-CM

## 2023-05-22 DIAGNOSIS — N958 Other specified menopausal and perimenopausal disorders: Secondary | ICD-10-CM | POA: Diagnosis not present

## 2023-07-25 DIAGNOSIS — H401131 Primary open-angle glaucoma, bilateral, mild stage: Secondary | ICD-10-CM | POA: Diagnosis not present

## 2023-11-04 DIAGNOSIS — F411 Generalized anxiety disorder: Secondary | ICD-10-CM | POA: Diagnosis not present

## 2023-11-17 DIAGNOSIS — Z0000 Encounter for general adult medical examination without abnormal findings: Secondary | ICD-10-CM | POA: Diagnosis not present

## 2023-11-17 DIAGNOSIS — K219 Gastro-esophageal reflux disease without esophagitis: Secondary | ICD-10-CM | POA: Diagnosis not present

## 2023-11-17 DIAGNOSIS — Z124 Encounter for screening for malignant neoplasm of cervix: Secondary | ICD-10-CM | POA: Diagnosis not present

## 2023-11-17 DIAGNOSIS — F5101 Primary insomnia: Secondary | ICD-10-CM | POA: Diagnosis not present

## 2023-11-17 DIAGNOSIS — Z205 Contact with and (suspected) exposure to viral hepatitis: Secondary | ICD-10-CM | POA: Diagnosis not present

## 2023-11-17 DIAGNOSIS — M8588 Other specified disorders of bone density and structure, other site: Secondary | ICD-10-CM | POA: Diagnosis not present

## 2023-11-17 DIAGNOSIS — F411 Generalized anxiety disorder: Secondary | ICD-10-CM | POA: Diagnosis not present

## 2023-11-17 DIAGNOSIS — E559 Vitamin D deficiency, unspecified: Secondary | ICD-10-CM | POA: Diagnosis not present

## 2023-11-23 DIAGNOSIS — R7301 Impaired fasting glucose: Secondary | ICD-10-CM | POA: Diagnosis not present

## 2023-11-27 ENCOUNTER — Other Ambulatory Visit: Payer: Self-pay | Admitting: Family Medicine

## 2023-11-27 DIAGNOSIS — Z1231 Encounter for screening mammogram for malignant neoplasm of breast: Secondary | ICD-10-CM

## 2023-11-27 DIAGNOSIS — N644 Mastodynia: Secondary | ICD-10-CM

## 2023-12-04 DIAGNOSIS — F411 Generalized anxiety disorder: Secondary | ICD-10-CM | POA: Diagnosis not present

## 2023-12-06 ENCOUNTER — Ambulatory Visit
Admission: RE | Admit: 2023-12-06 | Discharge: 2023-12-06 | Disposition: A | Discharge status: Home / Self Care | Source: Ambulatory Visit | Attending: Family Medicine | Admitting: Family Medicine

## 2023-12-06 DIAGNOSIS — N644 Mastodynia: Secondary | ICD-10-CM | POA: Diagnosis not present

## 2024-01-04 DIAGNOSIS — F411 Generalized anxiety disorder: Secondary | ICD-10-CM | POA: Diagnosis not present

## 2024-01-22 DIAGNOSIS — H401131 Primary open-angle glaucoma, bilateral, mild stage: Secondary | ICD-10-CM | POA: Diagnosis not present

## 2024-02-04 DIAGNOSIS — F411 Generalized anxiety disorder: Secondary | ICD-10-CM | POA: Diagnosis not present

## 2024-02-23 DIAGNOSIS — R7303 Prediabetes: Secondary | ICD-10-CM | POA: Diagnosis not present

## 2024-03-05 DIAGNOSIS — F411 Generalized anxiety disorder: Secondary | ICD-10-CM | POA: Diagnosis not present

## 2024-03-08 ENCOUNTER — Ambulatory Visit
Admission: RE | Admit: 2024-03-08 | Discharge: 2024-03-08 | Disposition: A | Discharge status: Home / Self Care | Source: Ambulatory Visit | Attending: Family Medicine | Admitting: Family Medicine

## 2024-03-08 DIAGNOSIS — Z1231 Encounter for screening mammogram for malignant neoplasm of breast: Secondary | ICD-10-CM | POA: Diagnosis not present

## 2024-03-18 DIAGNOSIS — K080 Exfoliation of teeth due to systemic causes: Secondary | ICD-10-CM | POA: Diagnosis not present

## 2024-03-27 DIAGNOSIS — K080 Exfoliation of teeth due to systemic causes: Secondary | ICD-10-CM | POA: Diagnosis not present

## 2024-04-05 DIAGNOSIS — F411 Generalized anxiety disorder: Secondary | ICD-10-CM | POA: Diagnosis not present

## 2024-04-30 DIAGNOSIS — J069 Acute upper respiratory infection, unspecified: Secondary | ICD-10-CM | POA: Diagnosis not present

## 2024-05-05 DIAGNOSIS — F411 Generalized anxiety disorder: Secondary | ICD-10-CM | POA: Diagnosis not present

## 2024-05-07 ENCOUNTER — Other Ambulatory Visit: Payer: Self-pay

## 2024-05-07 DIAGNOSIS — R109 Unspecified abdominal pain: Secondary | ICD-10-CM | POA: Diagnosis not present

## 2024-05-07 DIAGNOSIS — R1010 Upper abdominal pain, unspecified: Secondary | ICD-10-CM

## 2024-05-07 DIAGNOSIS — R143 Flatulence: Secondary | ICD-10-CM | POA: Diagnosis not present

## 2024-05-09 ENCOUNTER — Other Ambulatory Visit: Payer: Self-pay

## 2024-05-09 ENCOUNTER — Emergency Department (HOSPITAL_BASED_OUTPATIENT_CLINIC_OR_DEPARTMENT_OTHER)

## 2024-05-09 ENCOUNTER — Emergency Department (HOSPITAL_BASED_OUTPATIENT_CLINIC_OR_DEPARTMENT_OTHER)
Admission: EM | Admit: 2024-05-09 | Discharge: 2024-05-09 | Disposition: A | Discharge status: Home / Self Care | Attending: Emergency Medicine | Admitting: Emergency Medicine

## 2024-05-09 ENCOUNTER — Encounter (HOSPITAL_BASED_OUTPATIENT_CLINIC_OR_DEPARTMENT_OTHER): Payer: Self-pay | Admitting: Emergency Medicine

## 2024-05-09 DIAGNOSIS — K859 Acute pancreatitis without necrosis or infection, unspecified: Secondary | ICD-10-CM | POA: Insufficient documentation

## 2024-05-09 DIAGNOSIS — R1013 Epigastric pain: Secondary | ICD-10-CM | POA: Diagnosis not present

## 2024-05-09 DIAGNOSIS — E860 Dehydration: Secondary | ICD-10-CM | POA: Insufficient documentation

## 2024-05-09 DIAGNOSIS — R109 Unspecified abdominal pain: Secondary | ICD-10-CM

## 2024-05-09 HISTORY — DX: Diverticulitis of intestine, part unspecified, without perforation or abscess without bleeding: K57.92

## 2024-05-09 LAB — CBC WITH DIFFERENTIAL/PLATELET
Abs Immature Granulocytes: 0.07 K/uL (ref 0.00–0.07)
Basophils Absolute: 0 K/uL (ref 0.0–0.1)
Basophils Relative: 0 %
Eosinophils Absolute: 0.1 K/uL (ref 0.0–0.5)
Eosinophils Relative: 0 %
HCT: 40.6 % (ref 36.0–46.0)
Hemoglobin: 13.8 g/dL (ref 12.0–15.0)
Immature Granulocytes: 1 %
Lymphocytes Relative: 21 %
Lymphs Abs: 3.1 K/uL (ref 0.7–4.0)
MCH: 29.4 pg (ref 26.0–34.0)
MCHC: 34 g/dL (ref 30.0–36.0)
MCV: 86.6 fL (ref 80.0–100.0)
Monocytes Absolute: 0.9 K/uL (ref 0.1–1.0)
Monocytes Relative: 6 %
Neutro Abs: 10.7 K/uL — ABNORMAL HIGH (ref 1.7–7.7)
Neutrophils Relative %: 72 %
Platelets: 373 K/uL (ref 150–400)
RBC: 4.69 MIL/uL (ref 3.87–5.11)
RDW: 13.6 % (ref 11.5–15.5)
WBC: 14.9 K/uL — ABNORMAL HIGH (ref 4.0–10.5)
nRBC: 0 % (ref 0.0–0.2)

## 2024-05-09 LAB — COMPREHENSIVE METABOLIC PANEL WITH GFR
ALT: 139 U/L — ABNORMAL HIGH (ref 0–44)
AST: 91 U/L — ABNORMAL HIGH (ref 15–41)
Albumin: 4.2 g/dL (ref 3.5–5.0)
Alkaline Phosphatase: 249 U/L — ABNORMAL HIGH (ref 38–126)
Anion gap: 14 (ref 5–15)
BUN: 11 mg/dL (ref 8–23)
CO2: 26 mmol/L (ref 22–32)
Calcium: 9.8 mg/dL (ref 8.9–10.3)
Chloride: 96 mmol/L — ABNORMAL LOW (ref 98–111)
Creatinine, Ser: 0.98 mg/dL (ref 0.44–1.00)
GFR, Estimated: >60 mL/min (ref >60)
Glucose, Bld: 124 mg/dL — ABNORMAL HIGH (ref 70–99)
Potassium: 4 mmol/L (ref 3.5–5.1)
Sodium: 136 mmol/L (ref 135–145)
Total Bilirubin: 1 mg/dL (ref 0.0–1.2)
Total Protein: 8.3 g/dL — ABNORMAL HIGH (ref 6.5–8.1)

## 2024-05-09 LAB — URINALYSIS, MICROSCOPIC (REFLEX)

## 2024-05-09 LAB — URINALYSIS, ROUTINE W REFLEX MICROSCOPIC
Bilirubin Urine: NEGATIVE
Glucose, UA: NEGATIVE mg/dL
Ketones, ur: 40 mg/dL — AB
Nitrite: NEGATIVE
Protein, ur: 100 mg/dL — AB
Specific Gravity, Urine: 1.02 (ref 1.005–1.030)
pH: 5.5 (ref 5.0–8.0)

## 2024-05-09 LAB — LIPASE, BLOOD: Lipase: 95 U/L — ABNORMAL HIGH (ref 11–51)

## 2024-05-09 MED ORDER — ACETAMINOPHEN 325 MG PO TABS: 650.0000 mg | ORAL_TABLET | Freq: Four times a day (QID) | ORAL | 0 refills | Status: AC | PRN

## 2024-05-09 MED ORDER — ALUM & MAG HYDROXIDE-SIMETH 200-200-20 MG/5ML PO SUSP
30.0000 mL | Freq: Once | ORAL | Status: AC
  Administered 2024-05-09: 30 mL via ORAL
  Filled 2024-05-09: qty 30

## 2024-05-09 MED ORDER — SUCRALFATE 1 G PO TABS: 1.0000 g | ORAL_TABLET | Freq: Three times a day (TID) | ORAL | 0 refills | Status: AC

## 2024-05-09 MED ORDER — MORPHINE SULFATE (PF) 4 MG/ML IV SOLN
4.0000 mg | Freq: Once | INTRAVENOUS | Status: AC
  Administered 2024-05-09: 4 mg via INTRAVENOUS
  Filled 2024-05-09: qty 1

## 2024-05-09 MED ORDER — SODIUM CHLORIDE 0.9 % IV BOLUS
1000.0000 mL | Freq: Once | INTRAVENOUS | Status: AC
  Administered 2024-05-09: 1000 mL via INTRAVENOUS

## 2024-05-09 MED ORDER — ONDANSETRON HCL 4 MG/2ML IJ SOLN
4.0000 mg | Freq: Once | INTRAMUSCULAR | Status: AC
  Administered 2024-05-09: 4 mg via INTRAVENOUS
  Filled 2024-05-09: qty 2

## 2024-05-09 MED ORDER — IOHEXOL 300 MG/ML  SOLN
100.0000 mL | Freq: Once | INTRAMUSCULAR | Status: AC | PRN
  Administered 2024-05-09: 100 mL via INTRAVENOUS

## 2024-05-09 MED ORDER — OXYCODONE HCL 5 MG PO TABS
5.0000 mg | ORAL_TABLET | Freq: Once | ORAL | Status: AC
  Administered 2024-05-09: 5 mg via ORAL
  Filled 2024-05-09: qty 1

## 2024-05-09 MED ORDER — ONDANSETRON HCL 4 MG PO TABS: 4.0000 mg | ORAL_TABLET | Freq: Three times a day (TID) | ORAL | 0 refills | Status: AC | PRN

## 2024-05-09 MED ORDER — OXYCODONE HCL 5 MG PO TABS: 5.0000 mg | ORAL_TABLET | Freq: Four times a day (QID) | ORAL | 0 refills | Status: AC | PRN

## 2024-05-09 MED ORDER — PANTOPRAZOLE SODIUM 20 MG PO TBEC: 20.0000 mg | DELAYED_RELEASE_TABLET | Freq: Every day | ORAL | 0 refills | Status: AC

## 2024-05-09 NOTE — ED Provider Notes (Signed)
  EMERGENCY DEPARTMENT AT [facility] Provider Note  CSN: [id number] Arrival date & time: 05/09/24 9368  Chief Complaint(s) Abdominal Pain  HPI Kimberly Farrell is a 68 y.o. female with past medical history as below, significant for diverticulitis, cholecystectomy who presents to the ED with complaint of abdominal pain  Symptoms ongoing over the past 4 days.  Epigastric, periumbilical, generalized abdominal pain, sharp, stabbing at times, cramping.  Labor pains.  Worsened with p.o. intake, radiates to her back, having some nausea no vomiting.  No change in bowel or bladder function.  Does report reduced p.o. last few days secondary to discomfort seen by primary care 2 days ago and started on oral medication which does not seem to improve her symptoms.  No associated chest pain, dyspnea, diaphoresis, fevers or chills.  No change with urination.  Similar symptoms in the past attributed to diverticulitis per patient  Past Medical History Past Medical History:  Diagnosis Date   Diverticulitis    There are no active problems to display for this patient.  Home Medication(s) Prior to Admission medications   Medication Sig Start Date End Date Taking? Authorizing Provider  acetaminophen  (TYLENOL ) 325 MG tablet Take 2 tablets (650 mg total) by mouth every 6 (six) hours as needed. 05/09/24  Yes Elnor Savant A, DO  ondansetron  (ZOFRAN ) 4 MG tablet Take 1 tablet (4 mg total) by mouth every 8 (eight) hours as needed for nausea or vomiting. 05/09/24  Yes Elnor Savant A, DO  oxyCODONE  (ROXICODONE ) 5 MG immediate release tablet Take 1 tablet (5 mg total) by mouth every 6 (six) hours as needed for severe pain (pain score 7-10). 05/09/24  Yes Elnor Savant A, DO  pantoprazole (PROTONIX) 20 MG tablet Take 1 tablet (20 mg total) by mouth daily for 14 days. 05/09/24 05/23/24 Yes Elnor Savant A, DO  sucralfate (CARAFATE) 1 g tablet Take 1 tablet (1 g total) by mouth with breakfast, with lunch, and  with evening meal for 7 days. 05/09/24 05/16/24 Yes Elnor Savant A, DO  acetaminophen  (TYLENOL ) 325 MG tablet Take 650 mg by mouth every 6 (six) hours as needed.    [provider]  latanoprost (XALATAN) 0.005 % ophthalmic solution Place 1 drop into both eyes every evening. 07/25/22   [provider]  LORazepam (ATIVAN) 0.5 MG tablet Take 0.5 mg by mouth 2 (two) times daily as needed. 08/30/22   [provider]  TRAVATAN Z 0.004 % SOLN ophthalmic solution Place 1 drop into both eyes daily. 05/18/18   [provider]  esomeprazole (NEXIUM) 20 MG capsule Take 20 mg by mouth daily as needed (reflux).  08/17/20  [provider]                                                                                                                                    Past Surgical History Past Surgical History:  Procedure Laterality Date  CHOLECYSTECTOMY N/A 03/10/2013   Procedure: LAPAROSCOPIC CHOLECYSTECTOMY WITH INTRAOPERATIVE CHOLANGIOGRAM;  Surgeon: Bernarda Ned, MD;  Location: [facility];  Service: General;  Laterality: N/A;   Family History Family History  Problem Relation Age of Onset   Breast cancer Sister        11    Social History Social History   Tobacco Use   Smoking status: Never   Smokeless tobacco: Never  Vaping Use   Vaping status: Never Used  Substance Use Topics   Alcohol use: No   Drug use: No   Allergies Patient has no known allergies.  Review of Systems A thorough review of systems was obtained and all systems are negative except as noted in the HPI and PMH.   Physical Exam Vital Signs  I have reviewed the triage vital signs BP 137/87   Pulse 67   Temp 97.9 F (36.6 C) (Oral)   Resp 17   Ht 4' 11 (1.499 m)   Wt 60.8 kg   SpO2 99%   BMI 27.06 kg/m  Physical Exam Vitals and nursing note reviewed.  Constitutional:      General: She is not in acute distress.    Appearance: Normal appearance. She is well-developed. She  is not ill-appearing.  HENT:     Head: Normocephalic and atraumatic.     Right Ear: External ear normal.     Left Ear: External ear normal.     Nose: Nose normal.     Mouth/Throat:     Mouth: Mucous membranes are moist.  Eyes:     General: No scleral icterus.       Right eye: No discharge.        Left eye: No discharge.  Cardiovascular:     Rate and Rhythm: Normal rate.  Pulmonary:     Effort: Pulmonary effort is normal. No respiratory distress.     Breath sounds: No stridor.  Abdominal:     General: Abdomen is flat. There is no distension.     Tenderness: There is generalized abdominal tenderness and tenderness in the epigastric area. There is no guarding.  Musculoskeletal:        General: No deformity.     Cervical back: No rigidity.  Skin:    General: Skin is warm and dry.     Coloration: Skin is not cyanotic, jaundiced or pale.  Neurological:     Mental Status: She is alert.  Psychiatric:        Speech: Speech normal.        Behavior: Behavior normal. Behavior is cooperative.     ED Results and Treatments Labs (all labs ordered are listed, but only abnormal results are displayed) Labs Reviewed  COMPREHENSIVE METABOLIC PANEL WITH GFR - Abnormal; Notable for the following components:      Result Value   Chloride 96 (*)    Glucose, Bld 124 (*)    Total Protein 8.3 (*)    AST 91 (*)    ALT 139 (*)    Alkaline Phosphatase 249 (*)    All other components within normal limits  LIPASE, BLOOD - Abnormal; Notable for the following components:   Lipase 95 (*)    All other components within normal limits  CBC WITH DIFFERENTIAL/PLATELET - Abnormal; Notable for the following components:   WBC 14.9 (*)    Neutro Abs 10.7 (*)    All other components within normal limits  URINALYSIS, ROUTINE W REFLEX MICROSCOPIC - Abnormal; Notable for the following components:  Hgb urine dipstick MODERATE (*)    Ketones, ur 40 (*)    Protein, ur 100 (*)    Leukocytes,Ua TRACE (*)     All other components within normal limits  URINALYSIS, MICROSCOPIC (REFLEX) - Abnormal; Notable for the following components:   Bacteria, UA FEW (*)    All other components within normal limits                                                                                                                          Radiology CT ABDOMEN PELVIS W CONTRAST Result Date: 05/09/2024 CLINICAL DATA:  Left lower quadrant pain three days. EXAM: CT ABDOMEN AND PELVIS WITH CONTRAST TECHNIQUE: Multidetector CT imaging of the abdomen and pelvis was performed using the standard protocol following bolus administration of intravenous contrast. RADIATION DOSE REDUCTION: This exam was performed according to the departmental dose-optimization program which includes automated exposure control, adjustment of the mA and/or kV according to patient size and/or use of iterative reconstruction technique. CONTRAST:  OMNIPAQUE  IOHEXOL  300 MG/ML  SOLN COMPARISON:  09/01/2022 FINDINGS: Lower chest: Heart is normal size.  Visualized lung bases are clear. Hepatobiliary: Previous cholecystectomy. Liver and biliary tree are normal. Pancreas: Subtle ill definition of the peripancreatic fat planes adjacent the head of the pancreas which could be seen with mild acute pancreatitis. Spleen: Normal. Adrenals/Urinary Tract: Adrenal glands are normal. Kidneys are normal in size without focal mass. 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 Kidneys are otherwise unchanged. Ureters and bladder are normal. Stomach/Bowel: Stomach and small bowel are normal. Appendix is normal. Diverticulosis of the colon most notable over the descending colon and sigmoid colon. No active inflammation. Vascular/Lymphatic: Abdominal aorta is normal in caliber. Remaining vascular structures are unremarkable. No adenopathy. Reproductive: Uterus and bilateral adnexa are unremarkable.  Other: No significant free fluid. Musculoskeletal: No focal abnormality. IMPRESSION: 1. Subtle ill definition of the peripancreatic fat planes adjacent the head of the pancreas which could be seen with mild acute pancreatitis. Recommend correlation with pancreatic enzymes. 2. Colonic diverticulosis without active inflammation. 3. 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 Electronically Signed   By: Toribio Agreste M.D.   On: 05/09/2024 10:32    Pertinent labs & imaging results that were available during my care of the patient were reviewed by me and considered in my medical decision making (see MDM for details).  Medications Ordered in ED Medications  sodium chloride  0.9 % bolus 1,000 mL (0 mLs Intravenous Stopped 05/09/24 0840)  morphine  (PF) 4 MG/ML injection 4 mg (4 mg Intravenous Given 05/09/24 0719)  ondansetron  (ZOFRAN ) injection 4 mg (4 mg Intravenous Given 05/09/24 0718)  alum & mag hydroxide-simeth (MAALOX/MYLANTA) 200-200-20 MG/5ML suspension 30 mL (30 mLs Oral Given 05/09/24 0722)  iohexol  (OMNIPAQUE ) 300 MG/ML solution 100 mL (100 mLs Intravenous Contrast Given 05/09/24 0732)  oxyCODONE  (Oxy IR/ROXICODONE ) immediate  release tablet 5 mg (5 mg Oral Given 05/09/24 0945)                                                                                                                                     Procedures Procedures  (including critical care time)  Medical Decision Making / ED Course    Medical Decision Making:    AZORIA ABBETT is a 68 y.o. female with past medical history as below, significant for diverticulitis, cholecystectomy who presents to the ED with complaint of abdominal pain. The complaint involves an extensive differential diagnosis and also carries with it a high risk of complications and morbidity.  Serious etiology was considered. Ddx includes but is not limited to: Differential  diagnosis includes but is not exclusive to acute cholecystitis, intrathoracic causes for epigastric abdominal pain, gastritis, duodenitis, pancreatitis, small bowel or large bowel obstruction, abdominal aortic aneurysm, hernia, gastritis, etc.   Complete initial physical exam performed, notably the patient was in NAD.    Reviewed and confirmed nursing documentation for past medical history, family history, social history.  Vital signs reviewed.    Abdominal pain  Pancreatitis > - Abdomen soft, nonperitoneal, she is HDS - WBC 14.9, ketonuria / proteinuria noted  - IV fluids, GI cocktail, morphine  Zofran  - CTAP> mild pancreatitis  - Feeling better, tolerating p.o., no further vomiting or nausea.  No fever, no jaundice.  Well-appearing,  Clinical Course as of 05/09/24 1136  Thu May 09, 2024  0753 Lipase(!): 95 Epigastric pain w/ radiation to back, concern for pancreatitis. She has hx cholecystectomy, denies regular ETOH use. She is obese  [SG]    Clinical Course User Index [SG] Elnor Jayson LABOR, DO     11:36 AM:  I have discussed the diagnosis/risks/treatment options with the patient and family.  Evaluation and diagnostic testing in the emergency department does not suggest an emergent condition requiring admission or immediate intervention beyond what has been performed at this time.  They will follow up with pcp. We also discussed returning to the ED immediately if new or worsening sx occur. We discussed the sx which are most concerning (e.g., sudden worsening pain, fever, inability to tolerate by mouth) that necessitate immediate return.    The patient appears reasonably screened and/or stabilized for discharge and I doubt any other medical condition or other [facility] requiring further screening, evaluation, or treatment in the ED at this time prior to discharge.                 Additional history obtained: -Additional history obtained from family -External records from outside  source obtained and reviewed including: Chart review including previous notes, labs, imaging, consultation notes including  PDMP   Lab Tests: -I ordered, reviewed, and interpreted labs.   The pertinent results include:   Labs Reviewed  COMPREHENSIVE METABOLIC PANEL WITH GFR - Abnormal; Notable for the following components:  Result Value   Chloride 96 (*)    Glucose, Bld 124 (*)    Total Protein 8.3 (*)    AST 91 (*)    ALT 139 (*)    Alkaline Phosphatase 249 (*)    All other components within normal limits  LIPASE, BLOOD - Abnormal; Notable for the following components:   Lipase 95 (*)    All other components within normal limits  CBC WITH DIFFERENTIAL/PLATELET - Abnormal; Notable for the following components:   WBC 14.9 (*)    Neutro Abs 10.7 (*)    All other components within normal limits  URINALYSIS, ROUTINE W REFLEX MICROSCOPIC - Abnormal; Notable for the following components:   Hgb urine dipstick MODERATE (*)    Ketones, ur 40 (*)    Protein, ur 100 (*)    Leukocytes,Ua TRACE (*)    All other components within normal limits  URINALYSIS, MICROSCOPIC (REFLEX) - Abnormal; Notable for the following components:   Bacteria, UA FEW (*)    All other components within normal limits    Notable for as above  EKG   EKG Interpretation Date/Time:  Thursday May 09 2024 06:50:35 EST Ventricular Rate:  83 PR Interval:  138 QRS Duration:  75 QT Interval:  361 QTC Calculation: 425 R Axis:   55  Text Interpretation: Sinus rhythm Borderline T abnormalities, anterior leads Confirmed by Elnor Savant ([id number]) on 05/09/2024 7:09:49 AM         Imaging Studies ordered: I ordered imaging studies including CTAP I independently visualized the following imaging with scope of interpretation limited to determining acute life threatening conditions related to emergency care; findings noted above I agree with the radiologist interpretation If any imaging was obtained with contrast I  closely monitored patient for any possible adverse reaction a/w contrast administration in the emergency department   Medicines ordered and prescription drug management: Meds ordered this encounter  Medications   sodium chloride  0.9 % bolus 1,000 mL   morphine  (PF) 4 MG/ML injection 4 mg   ondansetron  (ZOFRAN ) injection 4 mg   alum & mag hydroxide-simeth (MAALOX/MYLANTA) 200-200-20 MG/5ML suspension 30 mL   iohexol  (OMNIPAQUE ) 300 MG/ML solution 100 mL   ondansetron  (ZOFRAN ) 4 MG tablet    Sig: Take 1 tablet (4 mg total) by mouth every 8 (eight) hours as needed for nausea or vomiting.    Dispense:  12 tablet    Refill:  0   pantoprazole  (PROTONIX ) 20 MG tablet    Sig: Take 1 tablet (20 mg total) by mouth daily for 14 days.    Dispense:  14 tablet    Refill:  0   sucralfate  (CARAFATE ) 1 g tablet    Sig: Take 1 tablet (1 g total) by mouth with breakfast, with lunch, and with evening meal for 7 days.    Dispense:  21 tablet    Refill:  0   oxyCODONE  (ROXICODONE ) 5 MG immediate release tablet    Sig: Take 1 tablet (5 mg total) by mouth every 6 (six) hours as needed for severe pain (pain score 7-10).    Dispense:  10 tablet    Refill:  0   acetaminophen  (TYLENOL ) 325 MG tablet    Sig: Take 2 tablets (650 mg total) by mouth every 6 (six) hours as needed.    Dispense:  36 tablet    Refill:  0   oxyCODONE  (Oxy IR/ROXICODONE ) immediate release tablet 5 mg    Refill:  0    -I  have reviewed the patients home medicines and have made adjustments as needed   Consultations Obtained: na   Cardiac Monitoring: Continuous pulse oximetry interpreted by myself, 99% on RA.    Social Determinants of Health:  Diagnosis or treatment significantly limited by social determinants of health: na   Reevaluation: After the interventions noted above, I reevaluated the patient and found that they have improved  Co morbidities that complicate the patient evaluation  Past Medical History:   Diagnosis Date   Diverticulitis       Dispostion: Disposition decision including need for hospitalization was considered, and patient discharged from emergency department.    Final Clinical Impression(s) / ED Diagnoses Final diagnoses:  Abdominal pain, unspecified abdominal location  Acute pancreatitis, unspecified complication status, unspecified pancreatitis type  Mild dehydration        Elnor Jayson LABOR, DO 05/09/24 1136

## 2024-05-09 NOTE — ED Triage Notes (Signed)
 Abdominal pain since Monday, see for Diverticulitis 2 years ago.

## 2024-05-09 NOTE — ED Notes (Signed)
 Pt provided water for PO challenge

## 2024-05-09 NOTE — Discharge Instructions (Addendum)
 It was a pleasure caring for you today in the emergency department.  Please follow a clear liquid diet over the next 5 to 7 days.  As your symptoms improve you can gradually increase your diet. Avoid spicy/fried foods, no alcohol or tobacco. Follow up with your pcp  Please return to the emergency department for any worsening or worrisome symptoms.

## 2024-05-17 ENCOUNTER — Inpatient Hospital Stay: Admission: RE | Admit: 2024-05-17 | Discharge: 2024-05-17

## 2024-05-17 DIAGNOSIS — R1010 Upper abdominal pain, unspecified: Secondary | ICD-10-CM

## 2024-05-17 DIAGNOSIS — K760 Fatty (change of) liver, not elsewhere classified: Secondary | ICD-10-CM | POA: Diagnosis not present
